# Patient Record
Sex: Female | Born: 1945 | Race: White | Hispanic: No | Smoking: Current every day smoker
Health system: Southern US, Community
[De-identification: ages and names within clinical notes are randomized; demographics above are authoritative.]

## PROBLEM LIST (undated history)

## (undated) DIAGNOSIS — I714 Abdominal aortic aneurysm, without rupture, unspecified: Secondary | ICD-10-CM

## (undated) DIAGNOSIS — T8859XA Other complications of anesthesia, initial encounter: Secondary | ICD-10-CM

## (undated) DIAGNOSIS — Z720 Tobacco use: Secondary | ICD-10-CM

## (undated) DIAGNOSIS — I639 Cerebral infarction, unspecified: Secondary | ICD-10-CM

## (undated) DIAGNOSIS — J449 Chronic obstructive pulmonary disease, unspecified: Secondary | ICD-10-CM

## (undated) DIAGNOSIS — I779 Disorder of arteries and arterioles, unspecified: Secondary | ICD-10-CM

## (undated) DIAGNOSIS — R58 Hemorrhage, not elsewhere classified: Secondary | ICD-10-CM

## (undated) DIAGNOSIS — I10 Essential (primary) hypertension: Secondary | ICD-10-CM

## (undated) DIAGNOSIS — D649 Anemia, unspecified: Secondary | ICD-10-CM

## (undated) DIAGNOSIS — T4145XA Adverse effect of unspecified anesthetic, initial encounter: Secondary | ICD-10-CM

## (undated) DIAGNOSIS — E785 Hyperlipidemia, unspecified: Secondary | ICD-10-CM

## (undated) DIAGNOSIS — R569 Unspecified convulsions: Secondary | ICD-10-CM

## (undated) DIAGNOSIS — I2510 Atherosclerotic heart disease of native coronary artery without angina pectoris: Secondary | ICD-10-CM

## (undated) DIAGNOSIS — K5790 Diverticulosis of intestine, part unspecified, without perforation or abscess without bleeding: Secondary | ICD-10-CM

## (undated) DIAGNOSIS — G459 Transient cerebral ischemic attack, unspecified: Secondary | ICD-10-CM

## (undated) DIAGNOSIS — I739 Peripheral vascular disease, unspecified: Secondary | ICD-10-CM

## (undated) DIAGNOSIS — R0602 Shortness of breath: Secondary | ICD-10-CM

## (undated) DIAGNOSIS — G40909 Epilepsy, unspecified, not intractable, without status epilepticus: Secondary | ICD-10-CM

## (undated) HISTORY — DX: Epilepsy, unspecified, not intractable, without status epilepticus: G40.909

## (undated) HISTORY — PX: COLON SURGERY: SHX602

## (undated) HISTORY — DX: Diverticulosis of intestine, part unspecified, without perforation or abscess without bleeding: K57.90

## (undated) HISTORY — DX: Peripheral vascular disease, unspecified: I73.9

## (undated) HISTORY — DX: Chronic obstructive pulmonary disease, unspecified: J44.9

## (undated) HISTORY — PX: COLOSTOMY CLOSURE: SHX1381

## (undated) HISTORY — DX: Abdominal aortic aneurysm, without rupture, unspecified: I71.40

## (undated) HISTORY — DX: Cerebral infarction, unspecified: I63.9

## (undated) HISTORY — DX: Hemorrhage, not elsewhere classified: R58

## (undated) HISTORY — PX: BRAIN SURGERY: SHX531

## (undated) HISTORY — DX: Disorder of arteries and arterioles, unspecified: I77.9

## (undated) HISTORY — DX: Tobacco use: Z72.0

## (undated) HISTORY — DX: Abdominal aortic aneurysm, without rupture: I71.4

## (undated) HISTORY — DX: Atherosclerotic heart disease of native coronary artery without angina pectoris: I25.10

## (undated) HISTORY — DX: Transient cerebral ischemic attack, unspecified: G45.9

## (undated) HISTORY — PX: OTHER SURGICAL HISTORY: SHX169

## (undated) HISTORY — DX: Hyperlipidemia, unspecified: E78.5

---

## 1979-09-01 DIAGNOSIS — I639 Cerebral infarction, unspecified: Secondary | ICD-10-CM

## 1979-09-01 HISTORY — DX: Cerebral infarction, unspecified: I63.9

## 1999-01-18 ENCOUNTER — Emergency Department (HOSPITAL_COMMUNITY): Admission: EM | Admit: 1999-01-18 | Discharge: 1999-01-19 | Payer: Self-pay | Admitting: Emergency Medicine

## 1999-01-19 ENCOUNTER — Encounter: Payer: Self-pay | Admitting: Emergency Medicine

## 1999-02-27 ENCOUNTER — Inpatient Hospital Stay (HOSPITAL_COMMUNITY): Admission: EM | Admit: 1999-02-27 | Discharge: 1999-02-28 | Payer: Self-pay | Admitting: Emergency Medicine

## 1999-03-02 ENCOUNTER — Encounter: Admission: RE | Admit: 1999-03-02 | Discharge: 1999-03-02 | Payer: Self-pay | Admitting: Family Medicine

## 1999-03-07 ENCOUNTER — Ambulatory Visit (HOSPITAL_COMMUNITY): Admission: RE | Admit: 1999-03-07 | Discharge: 1999-03-07 | Payer: Self-pay | Admitting: *Deleted

## 1999-03-24 ENCOUNTER — Encounter: Admission: RE | Admit: 1999-03-24 | Discharge: 1999-03-24 | Payer: Self-pay | Admitting: Family Medicine

## 1999-04-13 ENCOUNTER — Encounter: Admission: RE | Admit: 1999-04-13 | Discharge: 1999-04-13 | Payer: Self-pay | Admitting: Family Medicine

## 1999-04-20 ENCOUNTER — Encounter: Admission: RE | Admit: 1999-04-20 | Discharge: 1999-04-20 | Payer: Self-pay | Admitting: Family Medicine

## 1999-04-24 ENCOUNTER — Encounter: Admission: RE | Admit: 1999-04-24 | Discharge: 1999-04-24 | Payer: Self-pay | Admitting: Family Medicine

## 1999-05-05 ENCOUNTER — Encounter: Admission: RE | Admit: 1999-05-05 | Discharge: 1999-05-05 | Payer: Self-pay | Admitting: Family Medicine

## 1999-06-09 ENCOUNTER — Encounter: Admission: RE | Admit: 1999-06-09 | Discharge: 1999-06-09 | Payer: Self-pay | Admitting: Family Medicine

## 1999-06-21 ENCOUNTER — Ambulatory Visit (HOSPITAL_COMMUNITY): Admission: RE | Admit: 1999-06-21 | Discharge: 1999-06-21 | Payer: Self-pay | Admitting: Sports Medicine

## 2000-01-19 ENCOUNTER — Encounter: Payer: Self-pay | Admitting: Sports Medicine

## 2000-01-19 ENCOUNTER — Encounter: Admission: RE | Admit: 2000-01-19 | Discharge: 2000-01-19 | Payer: Self-pay | Admitting: Family Medicine

## 2000-01-19 ENCOUNTER — Encounter: Admission: RE | Admit: 2000-01-19 | Discharge: 2000-01-19 | Payer: Self-pay | Admitting: Sports Medicine

## 2000-06-24 ENCOUNTER — Ambulatory Visit (HOSPITAL_COMMUNITY): Admission: RE | Admit: 2000-06-24 | Discharge: 2000-06-24 | Payer: Self-pay | Admitting: *Deleted

## 2000-08-06 ENCOUNTER — Encounter: Admission: RE | Admit: 2000-08-06 | Discharge: 2000-08-06 | Payer: Self-pay | Admitting: Family Medicine

## 2000-08-19 ENCOUNTER — Encounter: Admission: RE | Admit: 2000-08-19 | Discharge: 2000-08-19 | Payer: Self-pay | Admitting: Family Medicine

## 2000-08-20 ENCOUNTER — Other Ambulatory Visit: Admission: RE | Admit: 2000-08-20 | Discharge: 2000-08-20 | Payer: Self-pay | Admitting: Family Medicine

## 2000-09-25 ENCOUNTER — Encounter: Admission: RE | Admit: 2000-09-25 | Discharge: 2000-09-25 | Payer: Self-pay | Admitting: Family Medicine

## 2000-11-26 ENCOUNTER — Encounter: Admission: RE | Admit: 2000-11-26 | Discharge: 2000-11-26 | Payer: Self-pay | Admitting: Family Medicine

## 2000-12-31 DIAGNOSIS — I2510 Atherosclerotic heart disease of native coronary artery without angina pectoris: Secondary | ICD-10-CM

## 2000-12-31 HISTORY — PX: OTHER SURGICAL HISTORY: SHX169

## 2000-12-31 HISTORY — DX: Atherosclerotic heart disease of native coronary artery without angina pectoris: I25.10

## 2001-01-30 ENCOUNTER — Encounter: Admission: RE | Admit: 2001-01-30 | Discharge: 2001-01-30 | Payer: Self-pay | Admitting: Family Medicine

## 2001-02-03 ENCOUNTER — Encounter: Payer: Self-pay | Admitting: Emergency Medicine

## 2001-02-03 ENCOUNTER — Emergency Department (HOSPITAL_COMMUNITY): Admission: EM | Admit: 2001-02-03 | Discharge: 2001-02-04 | Payer: Self-pay | Admitting: Emergency Medicine

## 2001-02-13 ENCOUNTER — Encounter: Admission: RE | Admit: 2001-02-13 | Discharge: 2001-02-13 | Payer: Self-pay | Admitting: Family Medicine

## 2001-03-13 ENCOUNTER — Encounter: Admission: RE | Admit: 2001-03-13 | Discharge: 2001-03-13 | Payer: Self-pay | Admitting: Sports Medicine

## 2001-05-14 ENCOUNTER — Encounter: Admission: RE | Admit: 2001-05-14 | Discharge: 2001-05-14 | Payer: Self-pay | Admitting: Family Medicine

## 2001-06-12 ENCOUNTER — Encounter: Payer: Self-pay | Admitting: Sports Medicine

## 2001-06-12 ENCOUNTER — Encounter: Admission: RE | Admit: 2001-06-12 | Discharge: 2001-06-12 | Payer: Self-pay | Admitting: Sports Medicine

## 2001-06-12 ENCOUNTER — Encounter: Admission: RE | Admit: 2001-06-12 | Discharge: 2001-06-12 | Payer: Self-pay | Admitting: Family Medicine

## 2001-06-15 ENCOUNTER — Emergency Department (HOSPITAL_COMMUNITY): Admission: EM | Admit: 2001-06-15 | Discharge: 2001-06-15 | Payer: Self-pay

## 2001-06-16 ENCOUNTER — Encounter: Admission: RE | Admit: 2001-06-16 | Discharge: 2001-06-16 | Payer: Self-pay | Admitting: Family Medicine

## 2001-08-17 ENCOUNTER — Encounter: Payer: Self-pay | Admitting: Emergency Medicine

## 2001-08-17 ENCOUNTER — Inpatient Hospital Stay (HOSPITAL_COMMUNITY): Admission: EM | Admit: 2001-08-17 | Discharge: 2001-08-20 | Payer: Self-pay | Admitting: Cardiology

## 2001-09-22 ENCOUNTER — Encounter: Admission: RE | Admit: 2001-09-22 | Discharge: 2001-09-22 | Payer: Self-pay | Admitting: Family Medicine

## 2001-11-19 ENCOUNTER — Encounter: Admission: RE | Admit: 2001-11-19 | Discharge: 2001-11-19 | Payer: Self-pay | Admitting: Family Medicine

## 2001-11-25 ENCOUNTER — Encounter: Payer: Self-pay | Admitting: Family Medicine

## 2001-11-25 ENCOUNTER — Ambulatory Visit (HOSPITAL_COMMUNITY): Admission: RE | Admit: 2001-11-25 | Discharge: 2001-11-25 | Payer: Self-pay | Admitting: Family Medicine

## 2002-02-19 ENCOUNTER — Encounter: Admission: RE | Admit: 2002-02-19 | Discharge: 2002-02-19 | Payer: Self-pay | Admitting: Family Medicine

## 2002-11-03 ENCOUNTER — Encounter: Admission: RE | Admit: 2002-11-03 | Discharge: 2002-11-03 | Payer: Self-pay | Admitting: Family Medicine

## 2002-12-21 ENCOUNTER — Encounter: Payer: Self-pay | Admitting: Family Medicine

## 2002-12-21 ENCOUNTER — Ambulatory Visit (HOSPITAL_COMMUNITY): Admission: RE | Admit: 2002-12-21 | Discharge: 2002-12-21 | Payer: Self-pay | Admitting: Family Medicine

## 2002-12-22 ENCOUNTER — Encounter: Admission: RE | Admit: 2002-12-22 | Discharge: 2002-12-22 | Payer: Self-pay | Admitting: Sports Medicine

## 2003-01-05 ENCOUNTER — Encounter: Admission: RE | Admit: 2003-01-05 | Discharge: 2003-01-05 | Payer: Self-pay | Admitting: Family Medicine

## 2003-10-07 ENCOUNTER — Encounter: Admission: RE | Admit: 2003-10-07 | Discharge: 2003-10-07 | Payer: Self-pay | Admitting: Family Medicine

## 2003-12-23 ENCOUNTER — Ambulatory Visit (HOSPITAL_COMMUNITY): Admission: RE | Admit: 2003-12-23 | Discharge: 2003-12-23 | Payer: Self-pay | Admitting: Sports Medicine

## 2004-03-29 ENCOUNTER — Ambulatory Visit (HOSPITAL_COMMUNITY): Admission: RE | Admit: 2004-03-29 | Discharge: 2004-03-29 | Payer: Self-pay | Admitting: Family Medicine

## 2004-03-29 ENCOUNTER — Encounter: Admission: RE | Admit: 2004-03-29 | Discharge: 2004-03-29 | Payer: Self-pay | Admitting: Family Medicine

## 2004-03-30 ENCOUNTER — Ambulatory Visit (HOSPITAL_COMMUNITY): Admission: RE | Admit: 2004-03-30 | Discharge: 2004-03-30 | Payer: Self-pay | Admitting: Family Medicine

## 2004-11-30 ENCOUNTER — Encounter (INDEPENDENT_AMBULATORY_CARE_PROVIDER_SITE_OTHER): Payer: Self-pay | Admitting: *Deleted

## 2004-11-30 LAB — CONVERTED CEMR LAB

## 2004-12-11 ENCOUNTER — Other Ambulatory Visit: Admission: RE | Admit: 2004-12-11 | Discharge: 2004-12-11 | Payer: Self-pay | Admitting: Family Medicine

## 2004-12-11 ENCOUNTER — Ambulatory Visit: Payer: Self-pay | Admitting: Sports Medicine

## 2005-07-25 ENCOUNTER — Ambulatory Visit: Payer: Self-pay | Admitting: Sports Medicine

## 2005-08-01 ENCOUNTER — Ambulatory Visit (HOSPITAL_COMMUNITY): Admission: RE | Admit: 2005-08-01 | Discharge: 2005-08-01 | Payer: Self-pay | Admitting: Family Medicine

## 2005-11-30 ENCOUNTER — Ambulatory Visit: Payer: Self-pay | Admitting: Family Medicine

## 2005-12-27 ENCOUNTER — Ambulatory Visit: Payer: Self-pay | Admitting: Sports Medicine

## 2005-12-31 HISTORY — PX: ABDOMINAL AORTIC ANEURYSM REPAIR: SUR1152

## 2006-01-20 ENCOUNTER — Ambulatory Visit: Payer: Self-pay | Admitting: Family Medicine

## 2006-01-20 ENCOUNTER — Inpatient Hospital Stay (HOSPITAL_COMMUNITY): Admission: AD | Admit: 2006-01-20 | Discharge: 2006-02-21 | Payer: Self-pay | Admitting: Family Medicine

## 2006-01-20 ENCOUNTER — Encounter: Payer: Self-pay | Admitting: Emergency Medicine

## 2006-01-20 ENCOUNTER — Ambulatory Visit: Payer: Self-pay | Admitting: Cardiology

## 2006-01-22 ENCOUNTER — Ambulatory Visit: Payer: Self-pay | Admitting: Gastroenterology

## 2006-01-23 ENCOUNTER — Ambulatory Visit: Payer: Self-pay | Admitting: Emergency Medicine

## 2006-01-23 ENCOUNTER — Encounter: Payer: Self-pay | Admitting: Cardiology

## 2006-01-24 ENCOUNTER — Ambulatory Visit: Payer: Self-pay | Admitting: Infectious Diseases

## 2006-01-29 ENCOUNTER — Encounter (INDEPENDENT_AMBULATORY_CARE_PROVIDER_SITE_OTHER): Payer: Self-pay | Admitting: *Deleted

## 2006-02-01 ENCOUNTER — Encounter (INDEPENDENT_AMBULATORY_CARE_PROVIDER_SITE_OTHER): Payer: Self-pay | Admitting: Specialist

## 2006-08-06 ENCOUNTER — Ambulatory Visit: Payer: Self-pay | Admitting: Internal Medicine

## 2006-08-13 ENCOUNTER — Ambulatory Visit: Payer: Self-pay | Admitting: Cardiology

## 2006-08-14 ENCOUNTER — Ambulatory Visit: Payer: Self-pay | Admitting: Internal Medicine

## 2006-08-14 ENCOUNTER — Encounter: Payer: Self-pay | Admitting: Internal Medicine

## 2006-08-19 ENCOUNTER — Emergency Department (HOSPITAL_COMMUNITY): Admission: EM | Admit: 2006-08-19 | Discharge: 2006-08-19 | Payer: Self-pay | Admitting: Emergency Medicine

## 2006-10-25 ENCOUNTER — Ambulatory Visit: Payer: Self-pay | Admitting: Pulmonary Disease

## 2006-10-25 ENCOUNTER — Inpatient Hospital Stay (HOSPITAL_COMMUNITY): Admission: RE | Admit: 2006-10-25 | Discharge: 2006-11-12 | Payer: Self-pay

## 2007-02-27 DIAGNOSIS — I6789 Other cerebrovascular disease: Secondary | ICD-10-CM

## 2007-02-27 DIAGNOSIS — G459 Transient cerebral ischemic attack, unspecified: Secondary | ICD-10-CM | POA: Insufficient documentation

## 2007-02-27 DIAGNOSIS — E781 Pure hyperglyceridemia: Secondary | ICD-10-CM | POA: Insufficient documentation

## 2007-02-27 DIAGNOSIS — I252 Old myocardial infarction: Secondary | ICD-10-CM

## 2007-02-27 DIAGNOSIS — R55 Syncope and collapse: Secondary | ICD-10-CM

## 2007-02-27 DIAGNOSIS — E7800 Pure hypercholesterolemia, unspecified: Secondary | ICD-10-CM

## 2007-02-27 DIAGNOSIS — F17200 Nicotine dependence, unspecified, uncomplicated: Secondary | ICD-10-CM

## 2007-02-28 ENCOUNTER — Encounter (INDEPENDENT_AMBULATORY_CARE_PROVIDER_SITE_OTHER): Payer: Self-pay | Admitting: *Deleted

## 2007-08-11 ENCOUNTER — Telehealth: Payer: Self-pay | Admitting: *Deleted

## 2007-08-15 ENCOUNTER — Ambulatory Visit: Payer: Self-pay | Admitting: Family Medicine

## 2007-08-15 ENCOUNTER — Encounter (INDEPENDENT_AMBULATORY_CARE_PROVIDER_SITE_OTHER): Payer: Self-pay | Admitting: Family Medicine

## 2007-08-15 DIAGNOSIS — D649 Anemia, unspecified: Secondary | ICD-10-CM

## 2007-08-15 LAB — CONVERTED CEMR LAB
ALT: 16 units/L (ref 0–35)
BUN: 17 mg/dL (ref 6–23)
Creatinine, Ser: 0.77 mg/dL (ref 0.40–1.20)
Glucose, Bld: 92 mg/dL (ref 70–99)
HDL: 36 mg/dL — ABNORMAL LOW (ref >39)
Potassium: 4.3 meq/L (ref 3.5–5.3)
RBC: 4.83 M/uL (ref 3.87–5.11)
RDW: 13.9 % (ref 11.5–14.0)
Sodium: 142 meq/L (ref 135–145)
Total Bilirubin: 0.5 mg/dL (ref 0.3–1.2)
Total CHOL/HDL Ratio: 4.8
WBC: 7.7 10*3/uL (ref 4.0–10.5)

## 2007-08-16 ENCOUNTER — Encounter (INDEPENDENT_AMBULATORY_CARE_PROVIDER_SITE_OTHER): Payer: Self-pay | Admitting: Family Medicine

## 2007-08-19 ENCOUNTER — Encounter (INDEPENDENT_AMBULATORY_CARE_PROVIDER_SITE_OTHER): Payer: Self-pay | Admitting: Infectious Diseases

## 2007-08-19 ENCOUNTER — Ambulatory Visit: Payer: Self-pay | Admitting: Cardiology

## 2008-04-09 ENCOUNTER — Ambulatory Visit (HOSPITAL_COMMUNITY): Admission: RE | Admit: 2008-04-09 | Discharge: 2008-04-09 | Payer: Self-pay | Admitting: Family Medicine

## 2008-07-19 ENCOUNTER — Encounter (INDEPENDENT_AMBULATORY_CARE_PROVIDER_SITE_OTHER): Payer: Self-pay | Admitting: Family Medicine

## 2008-07-19 ENCOUNTER — Other Ambulatory Visit: Admission: RE | Admit: 2008-07-19 | Discharge: 2008-07-19 | Payer: Self-pay | Admitting: Family Medicine

## 2008-07-19 ENCOUNTER — Ambulatory Visit: Payer: Self-pay | Admitting: Family Medicine

## 2008-07-19 DIAGNOSIS — N951 Menopausal and female climacteric states: Secondary | ICD-10-CM

## 2008-07-23 ENCOUNTER — Encounter: Payer: Self-pay | Admitting: Family Medicine

## 2008-07-23 LAB — CONVERTED CEMR LAB: Pap Smear: NORMAL

## 2008-09-01 ENCOUNTER — Inpatient Hospital Stay (HOSPITAL_COMMUNITY): Admission: EM | Admit: 2008-09-01 | Discharge: 2008-09-03 | Payer: Self-pay | Admitting: Emergency Medicine

## 2008-09-01 ENCOUNTER — Ambulatory Visit: Payer: Self-pay | Admitting: Family Medicine

## 2008-09-01 ENCOUNTER — Encounter: Payer: Self-pay | Admitting: Sports Medicine

## 2008-09-01 DIAGNOSIS — R569 Unspecified convulsions: Secondary | ICD-10-CM | POA: Insufficient documentation

## 2008-09-07 ENCOUNTER — Ambulatory Visit: Payer: Self-pay | Admitting: Family Medicine

## 2008-09-07 DIAGNOSIS — R5381 Other malaise: Secondary | ICD-10-CM | POA: Insufficient documentation

## 2008-09-07 DIAGNOSIS — R5383 Other fatigue: Secondary | ICD-10-CM

## 2008-09-08 ENCOUNTER — Ambulatory Visit: Payer: Self-pay | Admitting: Cardiology

## 2008-09-08 ENCOUNTER — Encounter: Payer: Self-pay | Admitting: Family Medicine

## 2008-09-23 ENCOUNTER — Encounter: Payer: Self-pay | Admitting: Family Medicine

## 2008-09-25 IMAGING — CR DG CHEST 2V
2 series · 2 of 2 positions shown · non-contrast
Comparison: 11/04/2006

CLINICAL DATA: Shortness of breath

CHEST - 2 VIEW

[w chest pa]
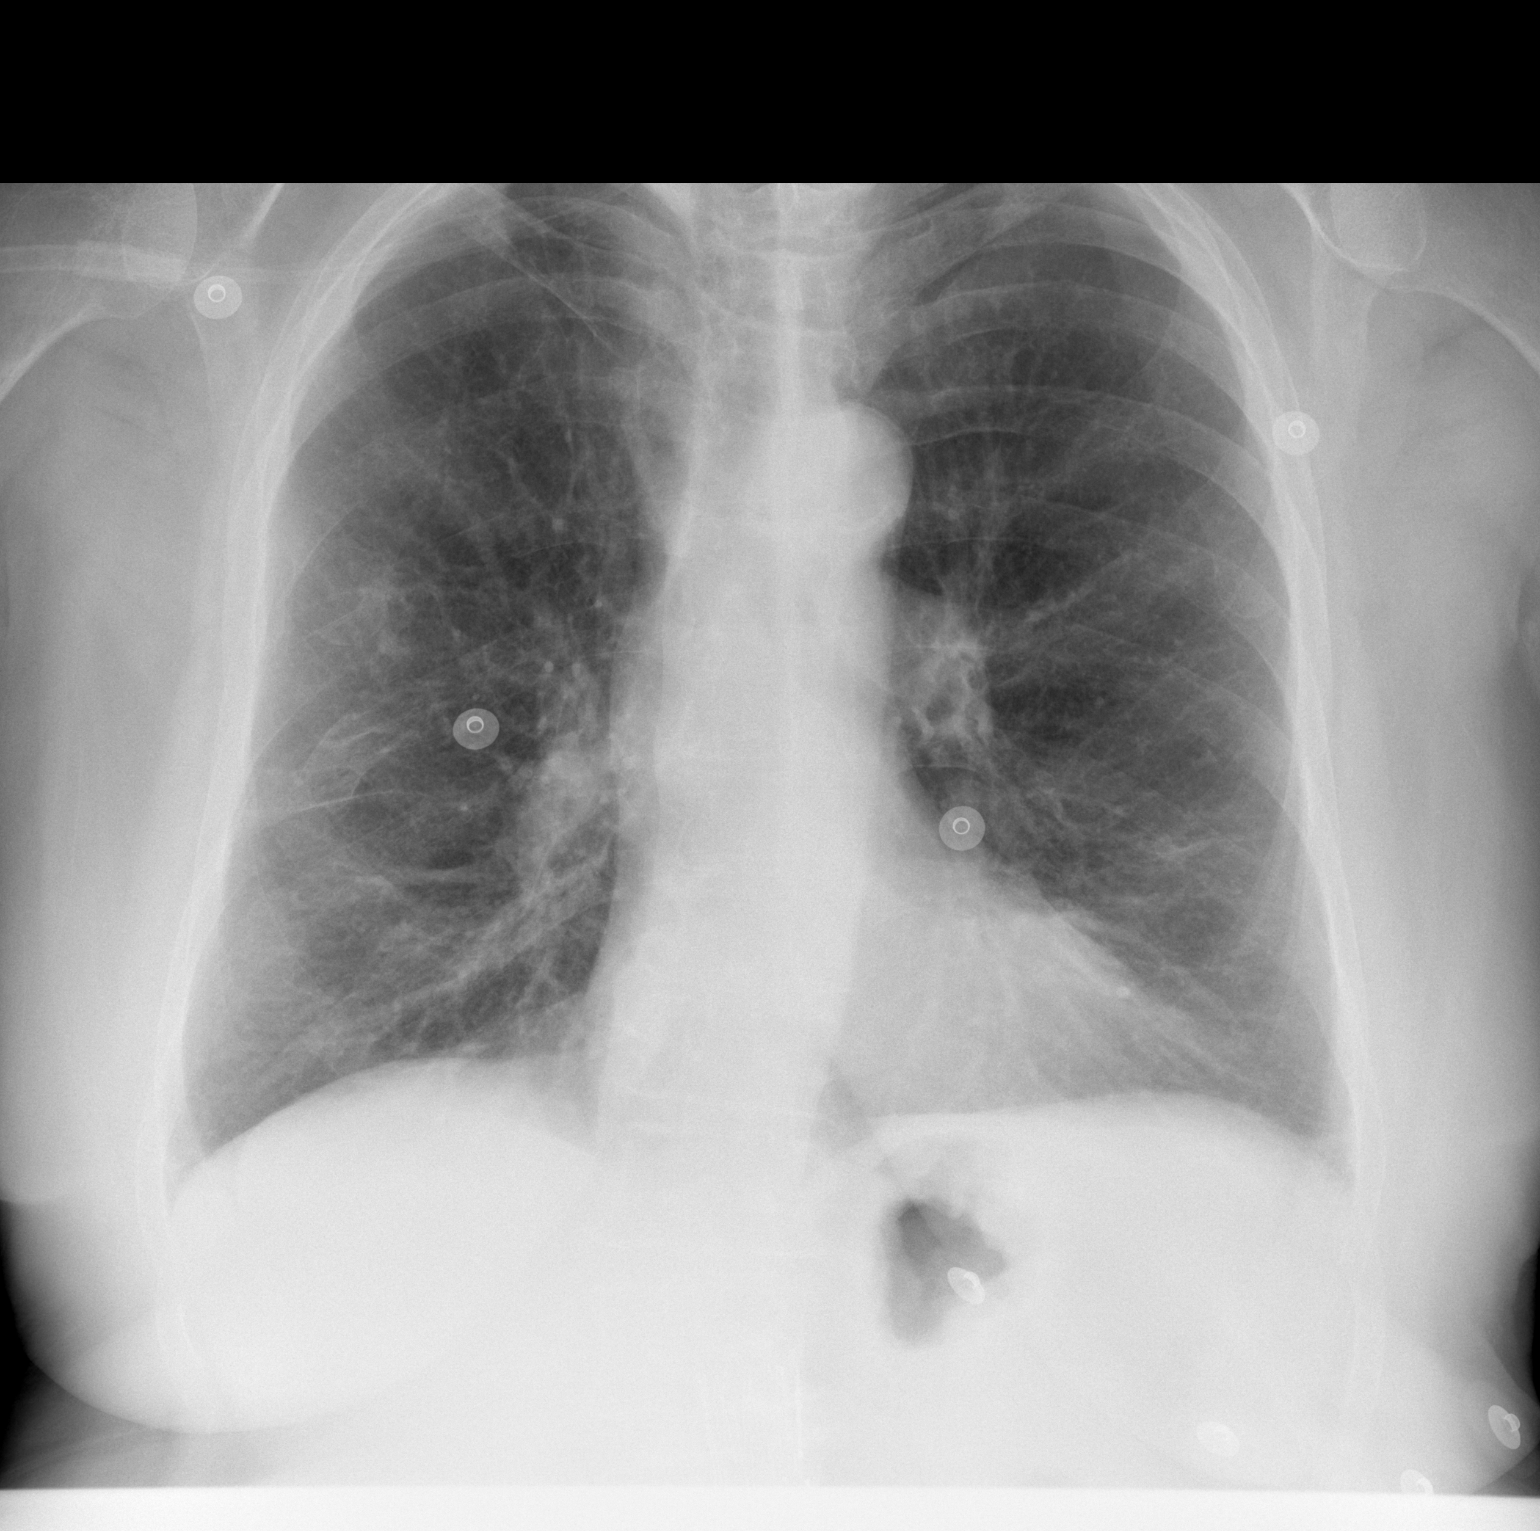

[w chest lat]
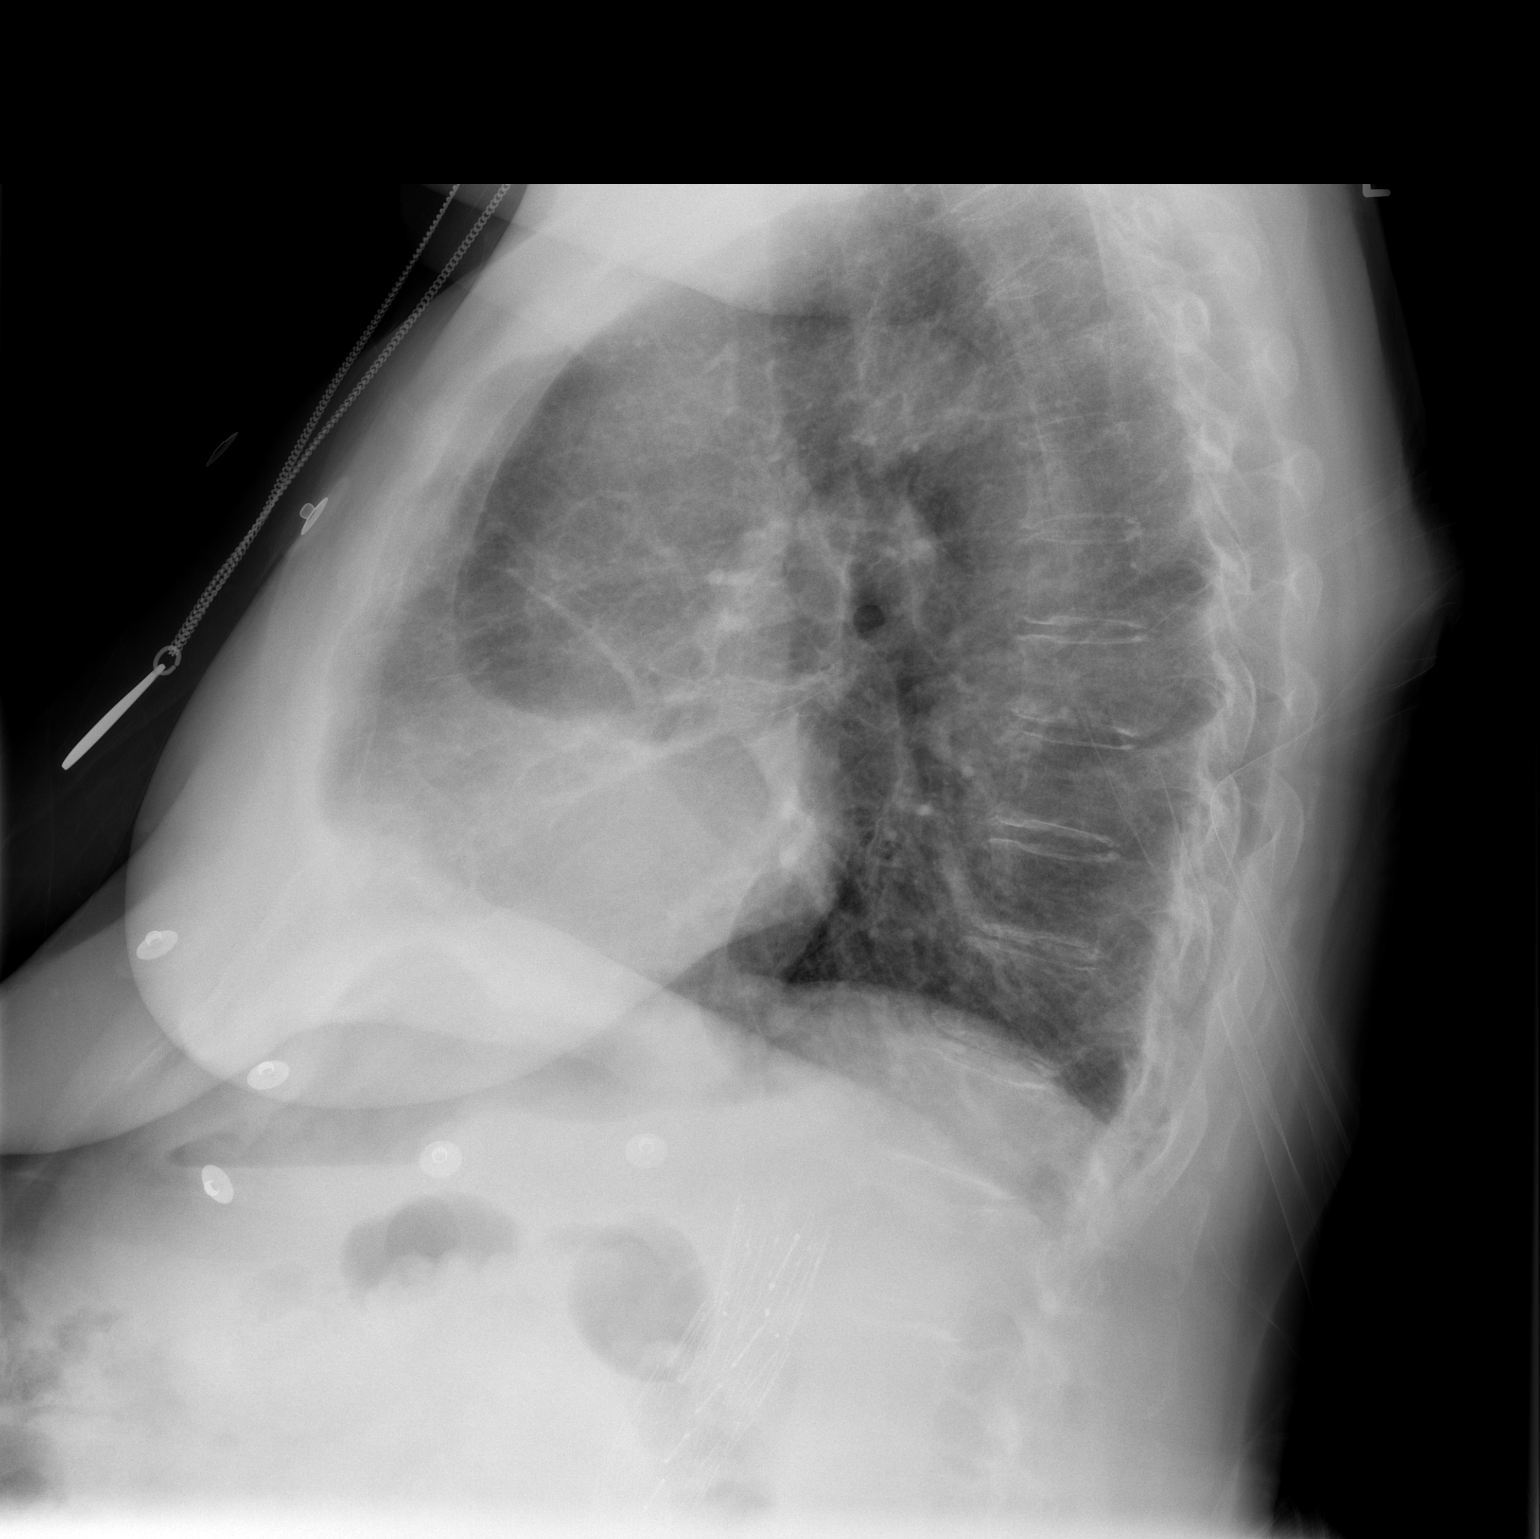

[2 of 2 positions shown; findings below may reference images not displayed]

FINDINGS: Chronic interstitial changes are redemonstrated.  Remote
posterior right rib fractures are noted.  The heart is normal.
Mediastinum and hila are negative for adenopathy.  Mid thoracic
spine degenerative changes.

Aortic vascular stent is identified.
IMPRESSION: Chronic interstitial lung changes.  Stable chest.

## 2008-09-26 IMAGING — CT CT HEAD W/O CM
1 of 2 series · 13 of 30 positions shown, 17 images · non-contrast
Comparison: 01/20/2006

CLINICAL DATA: Seizure.  Difficulty breathing.  Altered level of
consciousness.

CT HEAD WITHOUT CONTRAST
TECHNIQUE: Contiguous axial images were obtained from the base of
the skull through the vertex without contrast.

[Series 2: brain · axial · 0.47mm/px · z∈[+152,+295]mm · 13 of 32 slices shown, 17 images]
[im 3/32  brain]
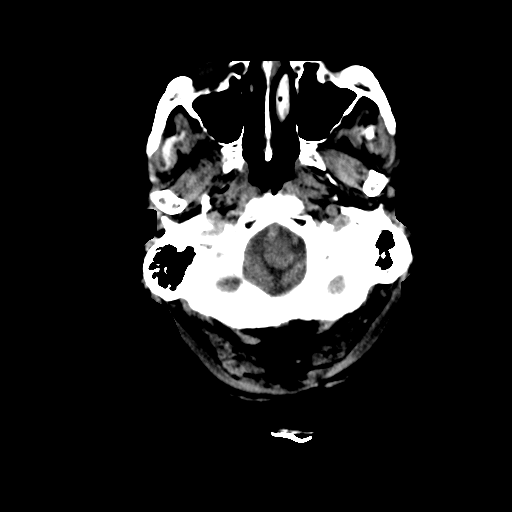
[im 3/32  bone]
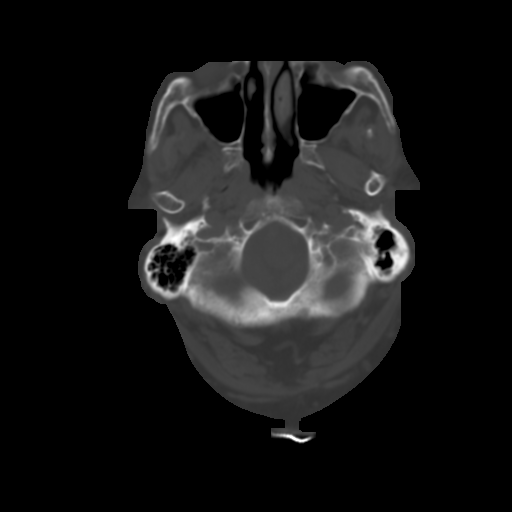
[im 5/32  brain]
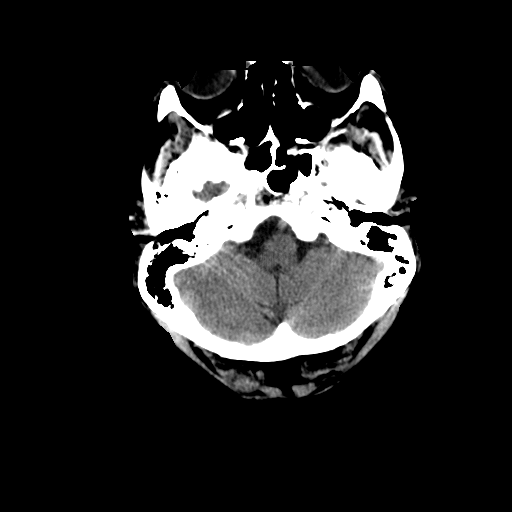
[im 7/32  brain]
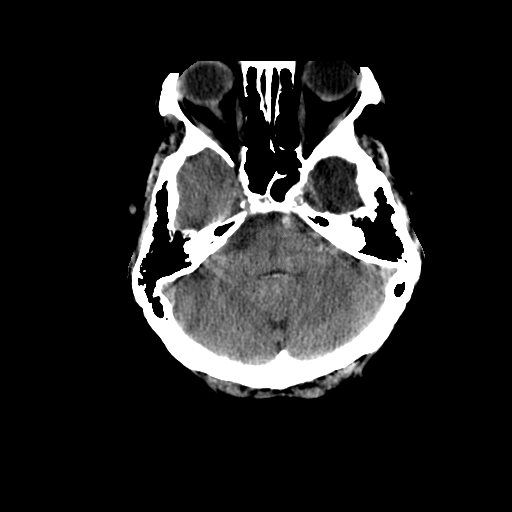
[im 9/32  brain]
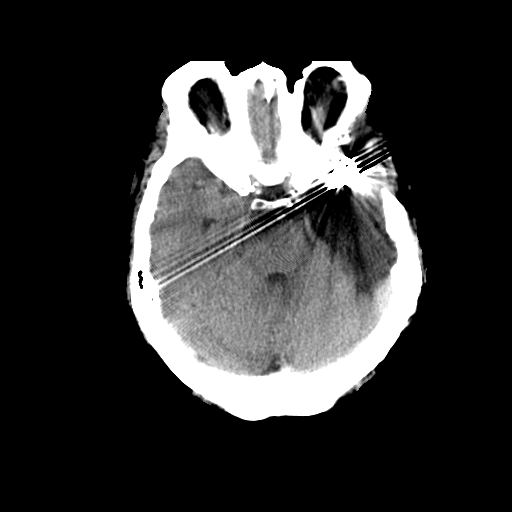
[im 12/32  brain]
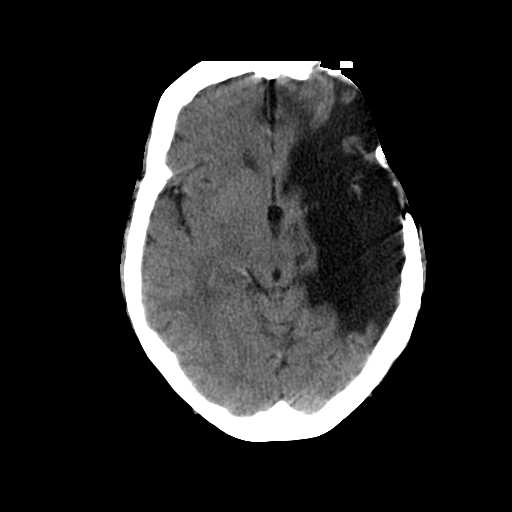
[im 12/32  bone]
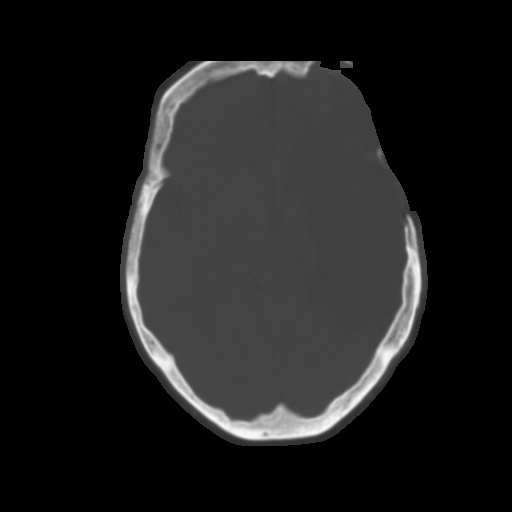
[im 14/32  brain]
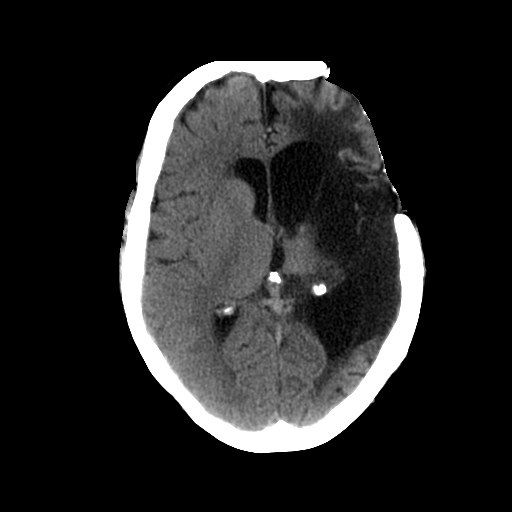
[im 16/32  brain]
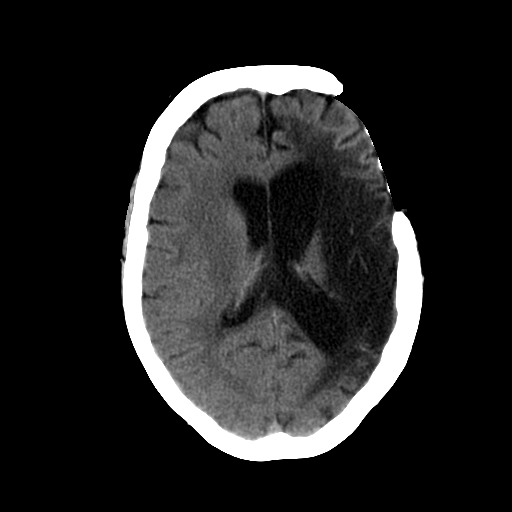
[im 18/32  brain]
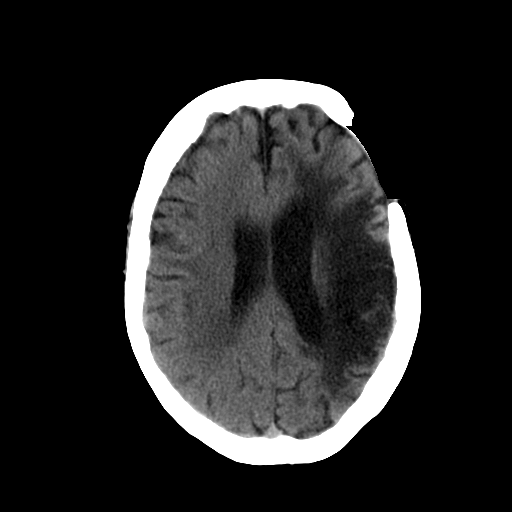
[im 20/32  brain]
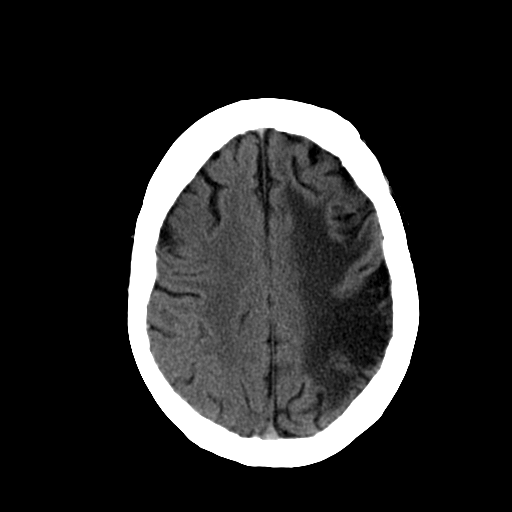
[im 20/32  bone]
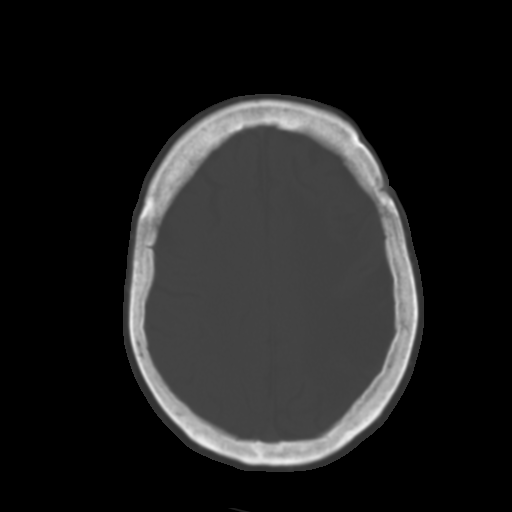
[im 23/32  brain]
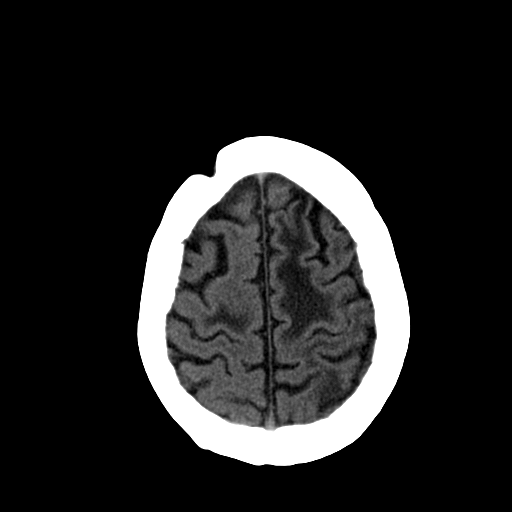
[im 25/32  brain]
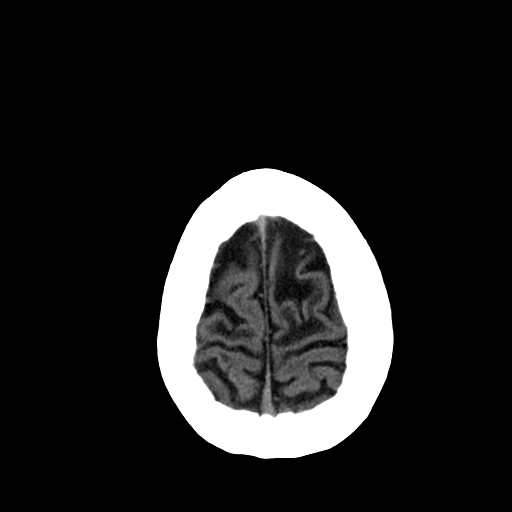
[im 27/32  brain]
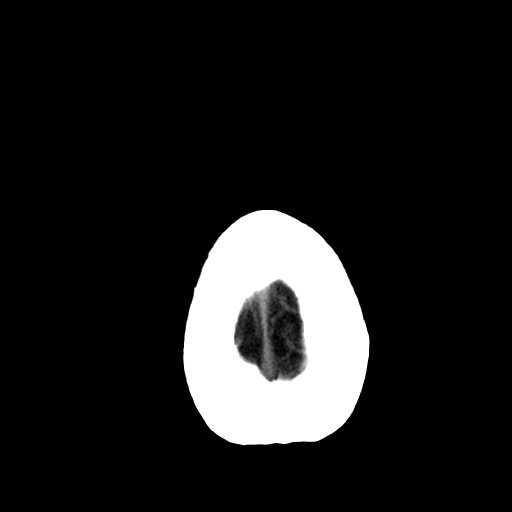
[im 29/32  brain]
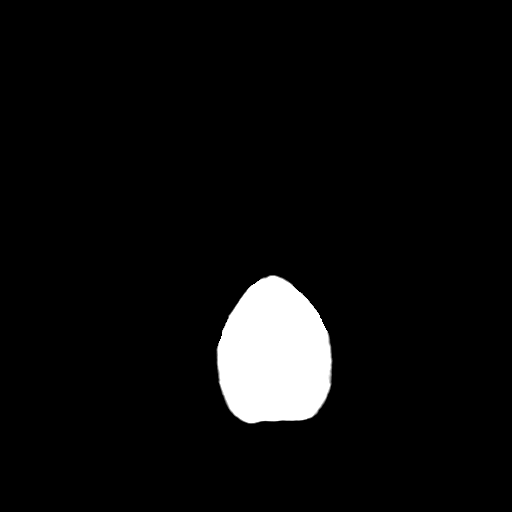
[im 29/32  bone]
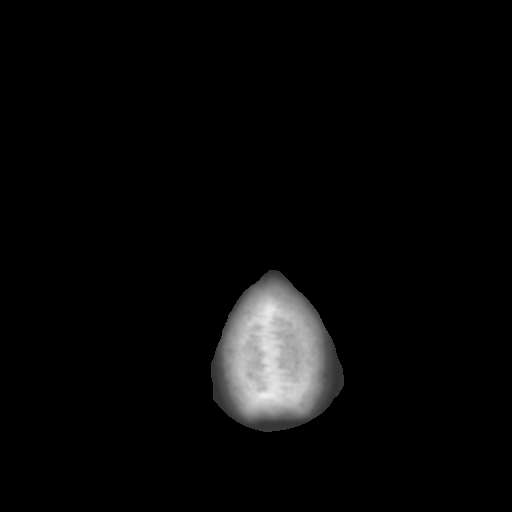

[13 of 30 positions shown; findings below may reference images not displayed]

FINDINGS: An extensive left MCA distribution infarct with a left
MCA aneurysmal clip is redemonstrated. Compensatory ventricular
dilatation.  No acute intracranial hemorrhage or edema.

The mastoid air cells and sinuses are clear Periventricular small
vessel ischemic changes are noted.
IMPRESSION: Remote left MCA distribution infarct.  Compensatory ventricular
dilatation.  No acute intracranial hemorrhage or edema.

## 2008-09-27 ENCOUNTER — Ambulatory Visit: Payer: Self-pay | Admitting: Family Medicine

## 2008-09-27 IMAGING — CR DG CHEST 1V PORT
1 series · 1 of 1 positions shown · non-contrast
Comparison: 08/31/2008

CLINICAL DATA: Seizure disorder.  Rule out aspiration.

PORTABLE CHEST - 1 VIEW

[AP]
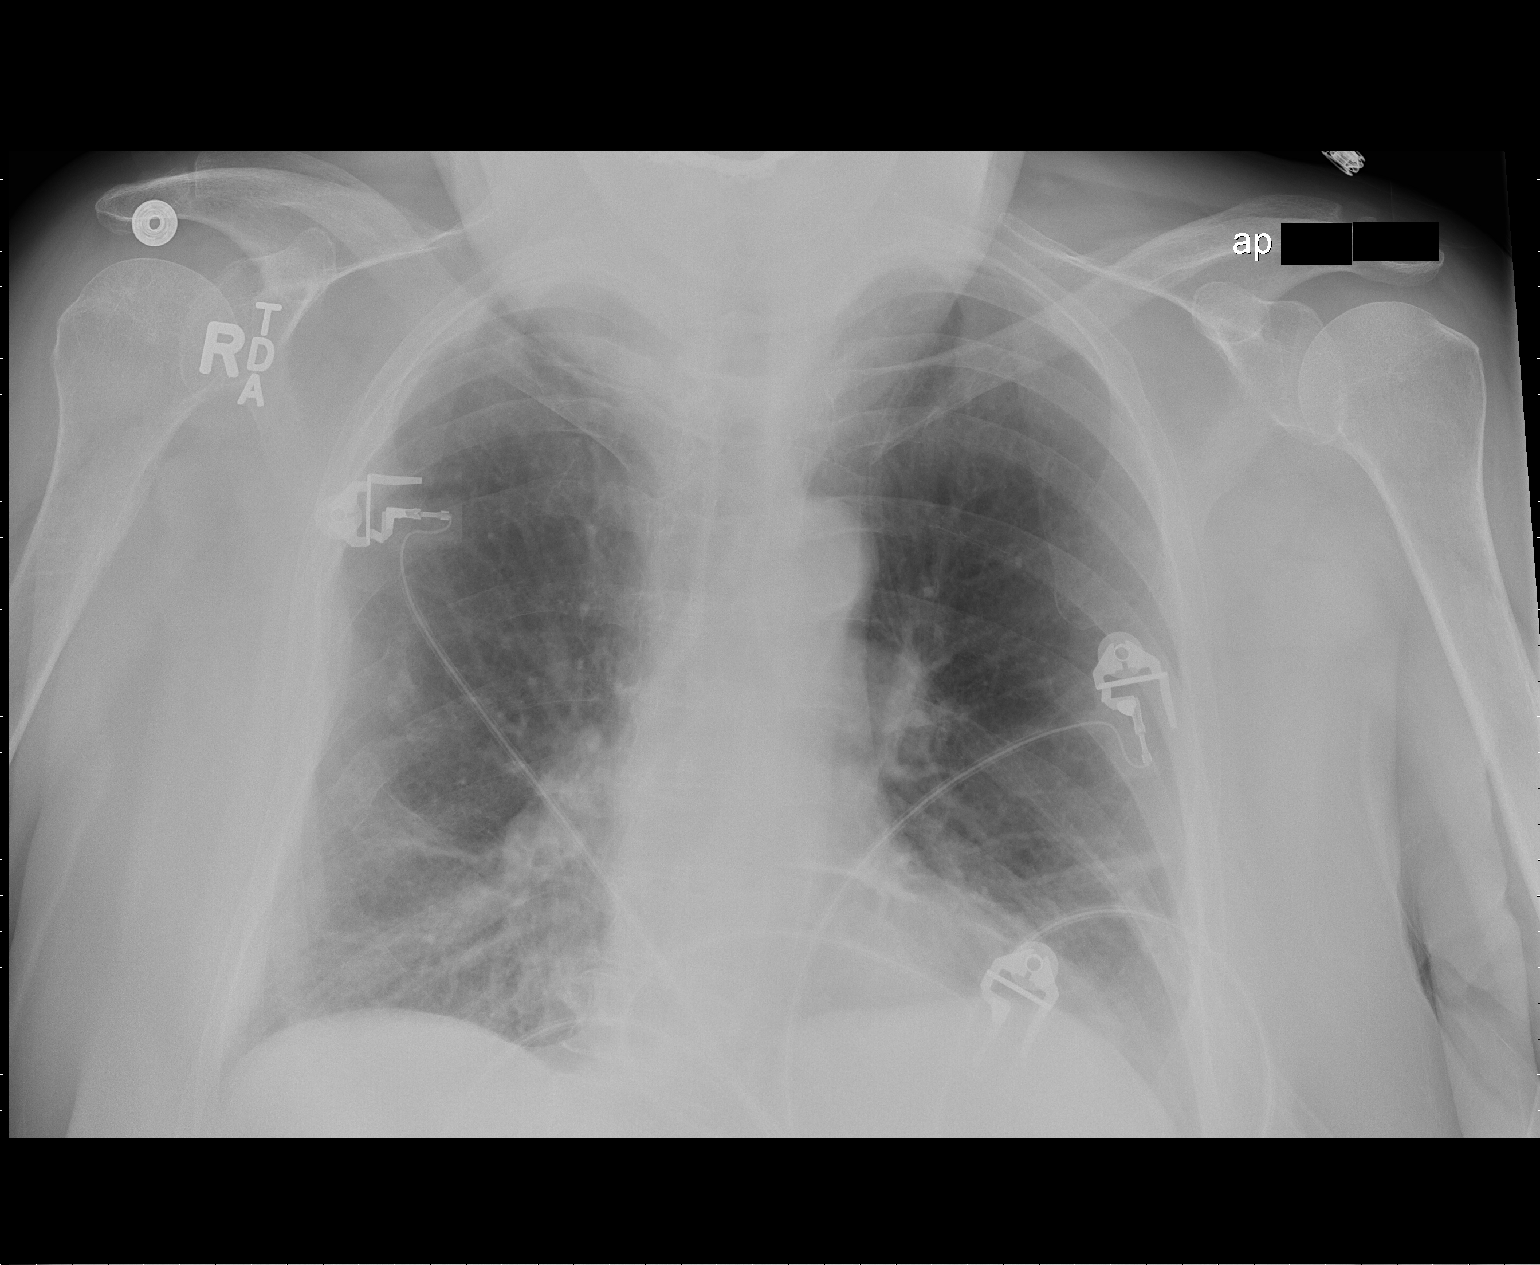

[1 of 1 positions shown; findings below may reference images not displayed]

FINDINGS: Linear densities at the lung bases compatible with
subsegmental atelectasis and/or scarring.  No airspace opacities.
No CHF.
IMPRESSION: Subsegmental atelectasis and/or scarring at the bases.  Negative
for pneumonia.

## 2008-10-20 ENCOUNTER — Telehealth: Payer: Self-pay | Admitting: *Deleted

## 2008-10-22 ENCOUNTER — Ambulatory Visit: Payer: Self-pay | Admitting: Family Medicine

## 2008-11-22 ENCOUNTER — Ambulatory Visit: Payer: Self-pay | Admitting: Family Medicine

## 2008-12-28 ENCOUNTER — Encounter: Payer: Self-pay | Admitting: Family Medicine

## 2009-02-28 ENCOUNTER — Telehealth: Payer: Self-pay | Admitting: *Deleted

## 2009-03-02 ENCOUNTER — Encounter: Payer: Self-pay | Admitting: Family Medicine

## 2009-03-03 ENCOUNTER — Ambulatory Visit: Payer: Self-pay | Admitting: Family Medicine

## 2009-03-18 ENCOUNTER — Encounter: Payer: Self-pay | Admitting: *Deleted

## 2009-03-22 ENCOUNTER — Encounter: Payer: Self-pay | Admitting: *Deleted

## 2009-03-22 ENCOUNTER — Encounter: Payer: Self-pay | Admitting: Family Medicine

## 2009-03-22 ENCOUNTER — Ambulatory Visit: Payer: Self-pay | Admitting: Family Medicine

## 2009-03-24 ENCOUNTER — Encounter: Payer: Self-pay | Admitting: Family Medicine

## 2009-03-24 LAB — CONVERTED CEMR LAB
AST: 16 units/L (ref 0–37)
BUN: 13 mg/dL (ref 6–23)
CO2: 28 meq/L (ref 19–32)
Calcium: 9.7 mg/dL (ref 8.4–10.5)
Chloride: 104 meq/L (ref 96–112)
Cholesterol: 161 mg/dL (ref 0–200)
Creatinine, Ser: 0.81 mg/dL (ref 0.40–1.20)
HDL: 35 mg/dL — ABNORMAL LOW (ref >39)
Total Bilirubin: 0.4 mg/dL (ref 0.3–1.2)
Total CHOL/HDL Ratio: 4.6

## 2009-05-09 ENCOUNTER — Encounter: Payer: Self-pay | Admitting: *Deleted

## 2009-05-10 ENCOUNTER — Telehealth: Payer: Self-pay | Admitting: Family Medicine

## 2009-05-16 ENCOUNTER — Encounter: Payer: Self-pay | Admitting: Family Medicine

## 2009-06-13 ENCOUNTER — Ambulatory Visit: Payer: Self-pay | Admitting: Family Medicine

## 2009-06-13 DIAGNOSIS — D239 Other benign neoplasm of skin, unspecified: Secondary | ICD-10-CM | POA: Insufficient documentation

## 2009-06-28 ENCOUNTER — Telehealth: Payer: Self-pay | Admitting: Family Medicine

## 2009-07-06 ENCOUNTER — Encounter (INDEPENDENT_AMBULATORY_CARE_PROVIDER_SITE_OTHER): Payer: Self-pay | Admitting: *Deleted

## 2009-07-22 ENCOUNTER — Encounter (INDEPENDENT_AMBULATORY_CARE_PROVIDER_SITE_OTHER): Payer: Self-pay | Admitting: Family Medicine

## 2009-08-01 ENCOUNTER — Encounter: Payer: Self-pay | Admitting: Family Medicine

## 2009-08-15 ENCOUNTER — Telehealth: Payer: Self-pay | Admitting: *Deleted

## 2009-08-15 ENCOUNTER — Encounter: Payer: Self-pay | Admitting: Family Medicine

## 2009-08-23 ENCOUNTER — Encounter (INDEPENDENT_AMBULATORY_CARE_PROVIDER_SITE_OTHER): Payer: Self-pay | Admitting: *Deleted

## 2009-10-12 ENCOUNTER — Ambulatory Visit: Payer: Self-pay | Admitting: Family Medicine

## 2009-10-12 ENCOUNTER — Encounter: Payer: Self-pay | Admitting: Family Medicine

## 2009-10-13 ENCOUNTER — Encounter: Payer: Self-pay | Admitting: Family Medicine

## 2009-10-21 ENCOUNTER — Encounter: Payer: Self-pay | Admitting: Family Medicine

## 2009-10-23 ENCOUNTER — Encounter: Payer: Self-pay | Admitting: Cardiology

## 2009-10-23 DIAGNOSIS — Z9889 Other specified postprocedural states: Secondary | ICD-10-CM

## 2009-10-24 ENCOUNTER — Ambulatory Visit: Payer: Self-pay | Admitting: Cardiology

## 2010-01-11 ENCOUNTER — Ambulatory Visit (HOSPITAL_COMMUNITY): Admission: RE | Admit: 2010-01-11 | Discharge: 2010-01-11 | Payer: Self-pay | Admitting: Family Medicine

## 2010-01-12 ENCOUNTER — Telehealth: Payer: Self-pay | Admitting: Internal Medicine

## 2010-01-23 ENCOUNTER — Encounter: Payer: Self-pay | Admitting: Family Medicine

## 2010-06-15 ENCOUNTER — Encounter: Payer: Self-pay | Admitting: Family Medicine

## 2010-06-20 ENCOUNTER — Ambulatory Visit: Payer: Self-pay | Admitting: Family Medicine

## 2010-06-20 ENCOUNTER — Encounter: Payer: Self-pay | Admitting: Family Medicine

## 2010-10-11 ENCOUNTER — Ambulatory Visit: Payer: Self-pay | Admitting: Cardiology

## 2010-10-16 ENCOUNTER — Telehealth: Payer: Self-pay | Admitting: Cardiology

## 2010-11-08 ENCOUNTER — Encounter: Payer: Self-pay | Admitting: Cardiology

## 2010-11-08 ENCOUNTER — Ambulatory Visit: Payer: Self-pay

## 2010-12-08 ENCOUNTER — Telehealth: Payer: Self-pay | Admitting: Family Medicine

## 2010-12-26 ENCOUNTER — Encounter: Payer: Self-pay | Admitting: Family Medicine

## 2011-01-22 ENCOUNTER — Encounter: Payer: Self-pay | Admitting: Family Medicine

## 2011-01-25 ENCOUNTER — Encounter (INDEPENDENT_AMBULATORY_CARE_PROVIDER_SITE_OTHER): Payer: Self-pay | Admitting: *Deleted

## 2011-01-30 NOTE — Assessment & Plan Note (Signed)
 Summary: Hospital H&P   Vital Signs:  Patient Profile:   66 Years Old Female O2 Sat:      91 % O2 treatment:    Room Air Temp:     98 degrees F oral Pulse rate:   86 / minute Resp:     20 per minute BP supine:   177 / 70                 PCP:  STEPHANIE ALM  MD   History of Present Illness: CC: SOB, Weakness, Abnormal movements  This is a 66 year old female with a peculiar history of events that resemble seizures.  It began today, the pt became short of breath and called her daughter.  Was having lots of difficulty breathing so daughter raced over to find her clutching the phone and unable to breathe.  Called EMS and they brought her here.  She would also have these events where her right hand would move in an abnormal way, and then she would have difficulty breathing.  No generalization noted.  She was possibly sometimes aware during these events and sometimes unaware.  Also has some diplopia that resolves when one eye is covered up.  Generalized weakness and dizziness.  No n/v/d/c, no fevers/chills, no chest pain, no dysuria/polyuria, no rectal bleeding/melena, no numbness/tingling.  Chronic cough at baseline.     Prior Medication List:  BAYER ASPIRIN  325 MG TABS (ASPIRIN ) Take 1 tablet by mouth once a day LIPITOR 80 MG TABS (ATORVASTATIN  CALCIUM ) Take 1/2  tablet by mouth once a day PROTONIX 40 MG TBEC (PANTOPRAZOLE SODIUM) Take 1 tablet by mouth twice a day ALBUTEROL  90 MCG/ACT AERS (ALBUTEROL ) 2 puffs every four hours if needed for trouble breathing METOPROLOL  TARTRATE 50 MG  TABS (METOPROLOL  TARTRATE) take 1 tab by mouth twice daily CALCIUM  500/D 500-400 MG-UNIT  CHEW (CALCIUM -VITAMIN D) one tablet twice a day CENTRUM   CHEW (MULTIPLE VITAMINS-MINERALS)    Current Allergies (reviewed today): ! PENICILLIN  Past Medical History:    Reviewed history from 08/15/2007 and no changes required:       card cath 08/2001 for NSTEMI -- stent RCAx2,        expressive aphasia,      H/o bigeminy/trigeminy        L MCA aneurysm--repaired 1982,       h/o CVA with residual expressive aphasia       h/o diverticulosis and perforation  Past Surgical History:    Reviewed history from 08/15/2007 and no changes required:       carotid dopplers(12/98) nl        mild COPD PFT`s 3/00--mild obstructive dz -       LOA and colostomy takedown 10/07       Infrarenal AAA repair 01/2006   Family History:    Reviewed history from 02/27/2007 and no changes required:       CVA, F--MI, M--aneurysm  Social History:    Reviewed history from 07/19/2008 and no changes required:       Divorced.  Disability due to expressive aphasia from aneurysm repair.  Smokes 1.5-2 ppd.  Patient lives by herself and her daughter helps with finances and transportation.     Review of Systems       As above in HPI   Physical Exam  General:     Did not appear to be in respiratory distress, well nourished,  Head:     Normocephalic and atraumatic without obvious abnormalities.  Eyes:     No corneal or conjunctival inflammation noted. EOMI. Perrl, no nystagmus, good conjugate gaze Ears:     External ear exam shows no significant lesions or deformities.  Otoscopic examination reveals normal TM on right, cerumen obstructing view on left.  No discharge. Hearing is grossly normal bilaterally. Nose:     External nasal examination shows no deformity or inflammation. Mouth:     Oral mucosa and oropharynx without lesions or exudates. Lungs:     Normal respiratory effort, chest expands symmetrically. Course sounds bilaterally, no wheeze appreciated. Heart:     Normal rate and regular rhythm. S1 and S2 normal without gallop, murmur, click, rub or other extra sounds. Abdomen:     Multiple healed scars, soft, NT/ND, +BS Pulses:     R and L carotid,radial,dorsalis pedis and posterior tibial pulses are full and equal bilaterally Extremities:     No clubbing, cyanosis, edema, or deformity  noted. Neurologic:     weak facial muscles bilaterally.  R hemiparesis.  expressive aphasia.  reflexes even bilaterally. Difficulty understanding and performing orders. Additional Exam:     CT Head: Negative for acute changes.  Signs of prior left MCA stroke with compensatory ventricular enlargement. CBC: Normal BMET: Normal ISTAT cardiac enzymes negative x1 CXR Chronic interstitial changes, stable CXR     Impression & Recommendations:  Problem # 1:  ? of SEIZURE DISORDER, COMPLEX PARTIAL (ICD-780.39) Admit to SDU.  Will hold on starting dilantin load at this time.  Plan to have neurology come see the patient for EEG and recommendations.  Ativan as needed.    Problem # 2:  COPD (ICD-496) Will start albuterol  q4h/q2h PRN and atrovent q6h nebs.  Does not seem  to be having a COPD exacerbation so will not start steroids or antibiotics at this time.  Will follow for goal sats of >88%.   Her updated medication list for this problem includes:    Albuterol  90 Mcg/act Aers (Albuterol ) .SABRA... 2 puffs every four hours if needed for trouble breathing   Problem # 3:  CORONARY, ARTERIOSCLEROSIS (ICD-414.00) Will do cardiac enzymes x2 to rule out MI.  ECG in AM   Her updated medication list for this problem includes:    Bayer Aspirin  325 Mg Tabs (Aspirin ) .SABRA... Take 1 tablet by mouth once a day    Metoprolol  Tartrate 50 Mg Tabs (Metoprolol  tartrate) .SABRA... Take 1 tab by mouth twice daily   Problem # 4:  TOBACCO DEPENDENCE (ICD-305.1) smoking cessation consult  Problem # 5:  HYPERCHOLESTEROLEMIA (ICD-272.0) Will hold statin for now.   Her updated medication list for this problem includes:    Lipitor 80 Mg Tabs (Atorvastatin  calcium ) .SABRA... Take 1/2  tablet by mouth once a day  Prophylaxis:  Protonix IV, and SCDs given Hx aneurysm    ]

## 2011-01-30 NOTE — Letter (Signed)
Summary: [facility] -Non Formulary Drug Form  [facility] -Non Formulary Drug Form   Imported By: Clydell Hakim 06/28/2010 15:00:13  _____________________________________________________________________  External Attachment:    Type:   Image     Comment:   External Document

## 2011-01-30 NOTE — Progress Notes (Signed)
Summary: refill   Phone Note Refill Request Call back at Home Phone [phone] Message from:  daughter-Linda  Refills Requested: Medication #1:  LIPITOR 80 MG TABS Take 1/2  tablet by mouth once a day [facility]   Initial call taken by: De Nurse,  December 08, 2010 11:56 AM  Follow-up for Phone Call        will ask preceptor if ok to send in for Dr. Lula Olszewski. Looks like last refill was not from this office. Follow-up by: Theresia Lo RN,  December 08, 2010 12:15 PM  Additional Follow-up for Phone Call Additional follow up Details #1::        Yes ok thanks  Additional Follow-up by: Pearlean Brownie MD,  December 08, 2010 2:29 PM    Prescriptions: LIPITOR 80 MG TABS (ATORVASTATIN CALCIUM) Take 1/2  tablet by mouth once a day  #30.0 Each x 2   Entered by:   Theresia Lo RN   Authorized by:   Ardyth Gal MD   Signed by:   Theresia Lo RN on 12/08/2010   Method used:   Electronically to        [facility] [address] [id number] (retail)       [address] [place], [place]  [address]       Ph: [phone]       Fax: [phone]   RxID:   [id number]

## 2011-01-30 NOTE — Miscellaneous (Signed)
SummaryChief Strategy Officer for supplies from [employer] [facility] completed.

## 2011-01-30 NOTE — Progress Notes (Signed)
Summary: calling regarding Carotid Doppler   Phone Note Call from Patient Call back at Home Phone [phone]   Caller: Daughter/ [name] Summary of Call: Pt daughter calling regarding Carotid Doppler Initial call taken by: Judie Grieve,  October 16, 2010 1:49 PM  Follow-up for Phone Call        pt hasn't had carotid since 1998, will order Meredith Staggers, RN  October 16, 2010 5:12 PM

## 2011-01-30 NOTE — Miscellaneous (Signed)
Summary: prior auth   Clinical Lists Changes pa for budesonide to pcp chart box.Golden Circle RN  June 20, 2010 11:40 AM  once again completed with fact that: patient has COPD (ICD 496) and Stroke (436) which does not allow her to coordinate inhalation with giving of an MDI/HFA inhaler. she needs nebulized treatments of steroids.    I have asked for approval for both 0.25 and 0.5 doses as pharmacy often doesn't have stock of one or the other at a given time.    faxed in. Ancil Boozer  MD  June 20, 2010 1:45 PM

## 2011-01-30 NOTE — Assessment & Plan Note (Signed)
Summary: f/up,tcb   Vital Signs:  Patient profile:   66 year old female Height:      62 inches Weight:      134.3 pounds BMI:     24.65 O2 Sat:      96 % on 2 L/min Temp:     97.8 degrees F oral Pulse rate:   73 / minute BP sitting:   122 / 76  (left arm) Cuff size:   regular  Vitals Entered By: Garen Grams LPN (June 20, 2010 4:22 PM)  O2 Flow:  2 L/min CC: f/u breathing Is Patient Diabetic? No Pain Assessment Patient in pain? no        Primary Care Provider:  Ancil Boozer  MD  CC:  f/u breathing.  History of Present Illness: breathing: overall doing well. continues to use her O2.  without it she will have gasping episodes at times so she is definately benefitting from O2 use.  she uses her portable system regularly including at home.  she continues to take her nebulized treatments as prescribed but due to shortages in the dosage she typically uses we are going to have to change.  also her insurance company is again fighting covering nebulized therapies for ms [place] despite documentation that she struggles with an MDI/HFA due to coordination problems after her stroke.   she continues to smoke.    in just 2 labs around part of [facility] clinic without oxygen she dropped her O2 to 89 %.  we chose at this point to put her back on her o2 at which point she could walk around 2 laps at 96% and with less shortness of breath.  she reports in trials at home without oxygen if she goes into a store in public without it she will get so short of breath they will have to go to the car to get it.  at times she even has her extremities turn blue in color.  this is rare however that she attempts this.   Habits & Providers  Alcohol-Tobacco-Diet     Tobacco Status: current     Tobacco Counseling: to quit use of tobacco products  Current Medications (verified): 1)  Bayer Low Strength 81 Mg Tbec (Aspirin) .Marland Kitchen.. 1 By Mouth Once Daily 2)  Lipitor 80 Mg Tabs (Atorvastatin Calcium) .... Take 1/2  Tablet  By Mouth Once A Day 3)  Albuterol Sulfate 1.25 Mg/10ml Nebu (Albuterol Sulfate) .Marland Kitchen.. 1 Nebulizer Treatment 3 or 4 Times Daily 4)  Metoprolol Tartrate 50 Mg  Tabs (Metoprolol Tartrate) .... Take 1 Tab By Mouth Once Daily 5)  Calcium 500/d 500-400 Mg-Unit  Chew (Calcium-Vitamin D) .... One Tablet Twice A Day 6)  Centrum   Chew (Multiple Vitamins-Minerals) .... One Daily 7)  Budesonide 0.25 Mg/57ml Susp (Budesonide) .Marland Kitchen.. 1 Vial Nebulized Two Times A Day For Copd/asthma Until Old Dose Is Back in [facility]. 8)  Keppra 500 Mg Tabs (Levetiracetam) .... Take One Tablet By Mouth Twice Daily. 9)  Proventil Hfa 108 (90 Base) Mcg/act Aers (Albuterol Sulfate) .... 2 Puffs Q4hr As Needed.  Has Diffcult Time Using.  Primarily Uses Nebs  Allergies (verified): 1)  ! Penicillin  Past History:  Past medical, surgical, family and social histories (including risk factors) reviewed for relevance to current acute and chronic problems.  Past Medical History: Reviewed history from 10/23/2009 and no changes required. CAD......08/2001  NSTEMI -- stent RCAx2  Bigeminy and trigeminy  with MI  2002 L MCA aneurysm--repaired 1982, h/o CVA with  residual expressive aphasia diverticulosis and perforation...colectomy... colostomy.... colostomy repair seizure disorder dx 08/2008 - followed by [facility].Marland KitchenMarland KitchenMarland KitchenHickling  COPD ...severe..- O2 dependent 24 hours AAA....stent repair February 2 007 Tobacco abuse EF...70%.. echo... 2007 Dyslipidemia  Past Surgical History: Reviewed history from 06/13/2009 and no changes required. Severe COPD PFTs - Severe obstruction- Lung Age  41/2009 LOA and colostomy takedown 10/07 Infrarenal AAA repair 01/2006 Bilateral ankle repairs Clips and plates after brain aneurysm - cannot get MRI as a result.   Family History: Reviewed history from 06/13/2009 and no changes required. F--MI?,  died of old age.  some prostate problems M--died of cancer in her 69s, unknown type Daughter - asthma Son -  crohns disease  Social History: Reviewed history from 06/13/2009 and no changes required. Divorced.  Disability due to expressive aphasia from aneurysm repair.  Smokes 1-2 ppd.  Patient lives by herself and her daughter [name]) helps with finances and transportation.   Review of Systems       per HPI  Physical Exam  General:  alert, well-hydrated, and overweight-appearing. pleasant.  smiling.  VS noted and WNL Lungs:  CTAB.  distant sounds. Heart:  normal rate and regular rhythm.  distant sounds.   Impression & Recommendations:  Problem # 1:  COPD (ICD-496) Assessment Unchanged  remains severe.  still using O2 as directed with her portable system 24/7.  she continues to benefit from O2 use in addition to her nebulized medications.  no changes to regimen.  again encouraged smoking cessation - she remains uninterested.    Her updated medication list for this problem includes:    Albuterol Sulfate 1.25 Mg/58ml Nebu (Albuterol sulfate) .Marland Kitchen... 1 nebulizer treatment 3 or 4 times daily    Budesonide 0.25 Mg/61ml Susp (Budesonide) .Marland Kitchen... 1 vial nebulized two times a day for copd/asthma until old dose is back in stock.    Proventil Hfa 108 (90 Base) Mcg/act Aers (Albuterol sulfate) .Marland Kitchen... 2 puffs q4hr as needed.  has diffcult time using.  primarily uses nebs  Orders: [facility]- Est Level  3 (33295)  Problem # 2:  TOBACCO ABUSE (ICD-305.1) Assessment: Unchanged  Orders: [facility]- Est Level  3 (18841)  The patient is strongly urged to stop smoking. she doesn't have the desire or will to do so.  reminded her to take off oxygen when smoking a cigarette to avoid burns.  Complete Medication List: 1)  Bayer Low Strength 81 Mg Tbec (Aspirin) .Marland Kitchen.. 1 by mouth once daily 2)  Lipitor 80 Mg Tabs (Atorvastatin calcium) .... Take 1/2  tablet by mouth once a day 3)  Albuterol Sulfate 1.25 Mg/76ml Nebu (Albuterol sulfate) .Marland Kitchen.. 1 nebulizer treatment 3 or 4 times daily 4)  Metoprolol Tartrate 50 Mg Tabs (Metoprolol  tartrate) .... Take 1 tab by mouth once daily 5)  Calcium 500/d 500-400 Mg-unit Chew (Calcium-vitamin d) .... One tablet twice a day 6)  Centrum Chew (Multiple vitamins-minerals) .... One daily 7)  Budesonide 0.25 Mg/18ml Susp (Budesonide) .Marland Kitchen.. 1 vial nebulized two times a day for copd/asthma until old dose is back in stock. 8)  Keppra 500 Mg Tabs (Levetiracetam) .... Take one tablet by mouth twice daily. 9)  Proventil Hfa 108 (90 Base) Mcg/act Aers (Albuterol sulfate) .... 2 puffs q4hr as needed.  has diffcult time using.  primarily uses nebs  Patient Instructions: 1)  Please follow up in 3 months or of course sooner if needed.

## 2011-01-30 NOTE — Assessment & Plan Note (Signed)
Summary: Q2V      Allergies Added:   Visit Type:  Follow-up Primary Provider:  [facility]  CC:  CAD.  History of Present Illness: The patient is seen for followup of coronary artery disease.  She is doing well.  She does not have any significant chest pain.  She had a non-STEMI in 2002.  Stents were placed to her right coronary artery.  She has not had exercise testing since that time.  We know that her left ventricular function remains normal by echo in 2007.  She's not having chest pain or shortness of breath.  She had a left cerebral aneurysm repair in 1982.  She also has had some seizures that are well treated.  She continues to smoke.  She has severe oxygen-dependent COPD.  Current Medications (verified): 1)  Bayer Low Strength 81 Mg Tbec (Aspirin) .Marland Kitchen.. 1 By Mouth Once Daily 2)  Lipitor 80 Mg Tabs (Atorvastatin Calcium) .... Take 1/2  Tablet By Mouth Once A Day 3)  Albuterol Sulfate 1.25 Mg/56ml Nebu (Albuterol Sulfate) .Marland Kitchen.. 1 Nebulizer Treatment 3 or 4 Times Daily 4)  Metoprolol Tartrate 50 Mg  Tabs (Metoprolol Tartrate) .... Take 1 Tab By Mouth Once Daily 5)  Calcium 500/d 500-400 Mg-Unit  Chew (Calcium-Vitamin D) .... One Tablet Twice A Day 6)  Centrum   Chew (Multiple Vitamins-Minerals) .... One Daily 7)  Budesonide 0.25 Mg/68ml Susp (Budesonide) .Marland Kitchen.. 1 Vial Nebulized Two Times A Day For Copd/asthma Until Old Dose Is Back in [place]. 8)  Keppra 500 Mg Tabs (Levetiracetam) .... Take One Tablet By Mouth Twice Daily. 9)  Proventil Hfa 108 (90 Base) Mcg/act Aers (Albuterol Sulfate) .... 2 Puffs Q4hr As Needed.  Has Diffcult Time Using.  Primarily Uses Nebs  Allergies (verified): 1)  ! Penicillin  Past History:  Past Medical History: CAD......08/2001  NSTEMI -- stent RCAx2  Bigeminy and trigeminy  with MI  2002 L MCA aneurysm--repaired 1982, h/o CVA with residual expressive aphasia diverticulosis and perforation...colectomy... colostomy.... colostomy repair seizure  disorder dx 08/2008 - [facility] by [facility].Marland KitchenMarland KitchenMarland KitchenHickling  COPD ...severe..- O2 dependent 24 hours AAA....stent repair February 2 007 Tobacco abuse EF...70%.. echo... 2007 Dyslipidemia Carotid bruit   October, 2011  Review of Systems       Patient denies fever, chills, headache, sweats, rash, change in vision, change in hearing, chest pain, nausea vomiting, urinary symptoms. She does have a cough from her COPD.   All other systems are reviewed and are negative.  Vital Signs:  Patient profile:   66 year old female Height:      62 inches Weight:      132 pounds BMI:     24.23 Pulse rate:   64 / minute BP sitting:   132 / 76  (left arm) Cuff size:   regular  Vitals Entered By: Hardin Negus, RMA (October 11, 2010 11:24 AM)  Physical Exam  General:  The patient is stable.  She is wearing her home oxygen. Head:  head is atraumatic. Eyes:  no xanthelasma. Neck:  no jugular venous distention. Chest Wall:  no chest wall tenderness. Lungs:  lung sounds are distant.  There is no respiratory distress. Heart:  cardiac exam reveals S1-S2.  There are no clicks or significant murmurs. Abdomen:  abdomen is soft. Msk:  patient has kyphosis of the spine. Extremities:  There is no peripheral edema. Skin:  no skin rashes. Psych:  patient is oriented to person time and place.  Affect is normal.  Impression & Recommendations:  Problem # 1:  * CAROTID BRUIT There is question of a soft carotid bruit.  I have reviewed all of our records and the hospital records and I cannot find a report of a carotid Doppler.  She may have had it in another setting.  Her daughter will be looking through her records.  Certainly one should be done if she has not had one in the past years.  Problem # 2:  TOBACCO ABUSE (ICD-305.1) I talked with the patient about her smoking.  I strongly encouraged her to stop.  Problem # 3:  ABDOMINAL AORTIC ANEURYSM REPAIR, HX OF (ICD-V15.1) The patient had her abdominal  aortic aneurysm repair with a stent graft in 2007.  She is doing well.  No further workup.  Problem # 4:  COPD (ICD-496)  Her updated medication list for this problem includes:    Albuterol Sulfate 1.25 Mg/61ml Nebu (Albuterol sulfate) .Marland Kitchen... 1 nebulizer treatment 3 or 4 times daily    Budesonide 0.25 Mg/59ml Susp (Budesonide) .Marland Kitchen... 1 vial nebulized two times a day for copd/asthma until old dose is back in stock.    Proventil Hfa 108 (90 Base) Mcg/act Aers (Albuterol sulfate) .Marland Kitchen... 2 puffs q4hr as needed.  has diffcult time using.  primarily uses nebs COPD is severe.  Of course stopping smoking would be the best therapy for her.  Problem # 5:  CVA (ICD-436) The patient is stable after the CVA she had many years ago.  She still has a mild expressive aphasia that is subtle.  Problem # 6:  SEIZURE DISORDER, COMPLEX PARTIAL (ICD-780.39)  Her updated medication list for this problem includes:    Keppra 500 Mg Tabs (Levetiracetam) .Marland Kitchen... Take one tablet by mouth twice daily. Her seizures are under good control.  No change in therapy.  Problem # 7:  CORONARY, ARTERIOSCLEROSIS (ICD-414.00)  Her updated medication list for this problem includes:    Bayer Low Strength 81 Mg Tbec (Aspirin) .Marland Kitchen... 1 by mouth once daily    Metoprolol Tartrate 50 Mg Tabs (Metoprolol tartrate) .Marland Kitchen... Take 1 tab by mouth once daily  Orders: EKG w/ Interpretation (93000) Coronary disease is stable.  EKG is done today and reviewed by me.  It was normal.  No further workup is needed.  Problem # 8:  HYPERCHOLESTEROLEMIA (ICD-272.0)  Her updated medication list for this problem includes:    Lipitor 80 Mg Tabs (Atorvastatin calcium) .Marland Kitchen... Take 1/2  tablet by mouth once a day Lipids are being treated.  No change in therapy.  I will see her back in 12 months.  We will be checking about the possibility of a carotid Doppler.   Patient Instructions: 1)  Please check through your records and give me a call to let me know about a  Carotid ultrasound, [phone] Ethel Rana. 2)  Your physician wants you to follow-up in:  1 year.  You will receive a reminder letter in the mail two months in advance. If you don't receive a letter, please call our office to schedule the follow-up appointment.

## 2011-01-30 NOTE — Miscellaneous (Signed)
Summary: med change  [facility] doe not have 5mg  budesonide in stock but does have 2.5mg .  this is the only preferred neb so will change to 2.5mg  neb until they can get 5mg  neb in stock. Ancil Boozer  MD  June 15, 2010 10:43 AM   Medications Added BUDESONIDE 0.25 MG/2ML SUSP (BUDESONIDE) 1 vial nebulized two times a day for copd/asthma until old dose is back in stock.       Clinical Lists Changes  Medications: Changed medication from PULMICORT 0.5 MG/2ML SUSP (BUDESONIDE) one inhalation once daily for COPD/Asthma to BUDESONIDE 0.25 MG/2ML SUSP (BUDESONIDE) 1 vial nebulized two times a day for copd/asthma until old dose is back in stock. - Signed Rx of BUDESONIDE 0.25 MG/2ML SUSP (BUDESONIDE) 1 vial nebulized two times a day for copd/asthma until old dose is back in stock.;  #60 x 1;  Signed;  Entered by: Ancil Boozer  MD;  Authorized by: Ancil Boozer  MD;  Method used: Electronically to [facility]. [phone]*, [address] [place], [place], [place]  [address], Ph: [phone], Fax: [phone]    Prescriptions: BUDESONIDE 0.25 MG/2ML SUSP (BUDESONIDE) 1 vial nebulized two times a day for copd/asthma until old dose is back in stock.  #60 x 1   Entered and Authorized by:   Ancil Boozer  MD   Signed by:   Ancil Boozer  MD on 06/15/2010   Method used:   Electronically to        [facility]. [phone]* (retail)       [address]       [place], [place]  [address]       Ph: [phone]       Fax: [phone]   RxID:   [id number]

## 2011-01-30 NOTE — Progress Notes (Signed)
Summary: Change Practice ---- GI   Phone Note Outgoing Call Call back at Home Phone [phone]   Call placed by: Harlow Mares CMA [facility]),  January 12, 2010 4:08 PM Call placed to: Patient Summary of Call: patient states that she already has her colonoscopy scheduled with another MD. I had Yesi put a note in IDX that the patient transfered care.  Initial call taken by: Harlow Mares CMA [facility]),  January 12, 2010 4:09 PM

## 2011-02-01 NOTE — Miscellaneous (Signed)
   Clinical Lists Changes  Problems: Removed problem of SPECIAL SCREENING FOR MALIGNANT NEOPLASMS COLON (ICD-V76.51) Removed problem of SPECIAL SCREENING FOR OSTEOPOROSIS (ICD-V82.81) Removed problem of SCREENING FOR MALIGNANT NEOPLASM OF THE CERVIX (ICD-V76.2) Removed problem of PREVENTIVE HEALTH CARE (ICD-V70.0) Observations: Added new observation of LLIMPORTMEDS: completed (12/26/2010 9:23)

## 2011-02-01 NOTE — Letter (Signed)
Summary: Generic Letter  [facility]  [address]   [place], [place] [address]   Phone: [phone]  Fax: [phone]    01/25/2011  [phone] [place], [place]  [address]  Dear [name] [name],  We are happy to let you know that since you are covered under Medicare you are able to have a FREE visit at the [facility] to discuss your HEALTH. This is a new benefit for Medicare.  There will be no co-payment.  At this visit you will meet with Arlys John an expert in wellness and the health coach at our clinic.  At this visit we will discuss ways to keep you healthy and feeling well.  This visit will not replace your regular doctor visit and we cannot refill medications.     You will need to plan to be here at least one hour to talk about your medical history, your current status, review all of your medications, and discuss your future plans for your health.  This information will be entered into your record for your doctor to have and review.  If you are interested in staying healthy, this type of visit can help.  Please call the office at: [phone], to schedule a "Medicare Wellness Visit".  The day of the visit you should bring in all of your medications, including any vitamins, herbs, over the counter products you take.  Make a list of all the other doctors that you see, so we know who they are. If you have any other health documents please bring them.  We look forward to helping you stay healthy.  Sincerely,   [name]  iAWV

## 2011-03-09 ENCOUNTER — Ambulatory Visit (INDEPENDENT_AMBULATORY_CARE_PROVIDER_SITE_OTHER): Payer: Medicare Other | Admitting: Family Medicine

## 2011-03-09 ENCOUNTER — Encounter: Payer: Self-pay | Admitting: Family Medicine

## 2011-03-09 VITALS — BP 108/64 | Temp 97.4°F | Ht 60.5 in | Wt 132.6 lb

## 2011-03-09 DIAGNOSIS — I2510 Atherosclerotic heart disease of native coronary artery without angina pectoris: Secondary | ICD-10-CM

## 2011-03-09 DIAGNOSIS — I252 Old myocardial infarction: Secondary | ICD-10-CM

## 2011-03-09 DIAGNOSIS — D649 Anemia, unspecified: Secondary | ICD-10-CM

## 2011-03-09 DIAGNOSIS — F17200 Nicotine dependence, unspecified, uncomplicated: Secondary | ICD-10-CM

## 2011-03-09 DIAGNOSIS — R569 Unspecified convulsions: Secondary | ICD-10-CM

## 2011-03-09 DIAGNOSIS — J449 Chronic obstructive pulmonary disease, unspecified: Secondary | ICD-10-CM

## 2011-03-09 DIAGNOSIS — E7800 Pure hypercholesterolemia, unspecified: Secondary | ICD-10-CM

## 2011-03-09 DIAGNOSIS — Z7189 Other specified counseling: Secondary | ICD-10-CM

## 2011-03-09 LAB — CONVERTED CEMR LAB
Direct LDL: 83 mg/dL
RBC: 4.49 M/uL (ref 3.87–5.11)
Vitamin B-12: 664 pg/mL (ref 211–911)
WBC: 8.6 10*3/uL (ref 4.0–10.5)

## 2011-03-09 LAB — CBC
MCV: 98 fL (ref 78.0–100.0)
Platelets: 255 10*3/uL (ref 150–400)
RBC: 4.49 MIL/uL (ref 3.87–5.11)
WBC: 8.6 10*3/uL (ref 4.0–10.5)

## 2011-03-09 LAB — LDL CHOLESTEROL, DIRECT: Direct LDL: 83 mg/dL

## 2011-03-09 MED ORDER — ALBUTEROL SULFATE HFA 108 (90 BASE) MCG/ACT IN AERS: 2.0000 | INHALATION_SPRAY | RESPIRATORY_TRACT | Status: DC | PRN

## 2011-03-09 NOTE — Patient Instructions (Signed)
Please make an appointment to see our pharmacist for pulmonary function testing. I will call you with the results. Please look over the papers regarding the advance directives.  I will also call your daughter with the lab results.  Please schedule mammogram soon.  Please also schedule a follow-up appointment with your Neurologist and ask him to please send me a copy of the office visit note.  Please follow-up with me in 3 months.

## 2011-03-10 LAB — VITAMIN B12: Vitamin B-12: 664 pg/mL (ref 211–911)

## 2011-03-11 ENCOUNTER — Encounter: Payer: Self-pay | Admitting: Family Medicine

## 2011-03-11 DIAGNOSIS — Z7189 Other specified counseling: Secondary | ICD-10-CM | POA: Insufficient documentation

## 2011-03-11 NOTE — Assessment & Plan Note (Signed)
Controlled on statin. Continue.

## 2011-03-11 NOTE — Assessment & Plan Note (Signed)
May be worsening based on daughter's subjective impression. Will check PFTs. No changes to medications for now.

## 2011-03-11 NOTE — Assessment & Plan Note (Signed)
Stable. No seizures and neurological/psychologic status stable. Daughter will make follow-up appointment with Dr. Sharene Skeans (patient due for one). Last visit about 6 months ago. Continue Keppra.

## 2011-03-11 NOTE — Progress Notes (Signed)
  Subjective:    Patient ID: [name] [name], female    DOB: 1945/10/14, 66 y.o.   MRN: [id number]  HPI 1. Hypoxia Daughter concerned patient more difficulty breathing with activity. Turns blue around lips. Hands feel cold.  Patient long-time smoker. Not interested in quitting.  Uses 2L O2 at home. Sating 90% on RA here in the office.  ROS: denies fever. Uses albuterol nebs occasionally. Pulmicort regularly.   2. Mental status No recent changes. No seizures. Still independent. Lives alone but daughter nearby and regularly helps care for her.  No Advance Directives done yet.  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 No new changes with this.    Review of Systems     Objective:   Physical Exam  Constitutional: She appears well-developed and well-nourished.       Heavy smell of tobacco.   HENT:  Head: Normocephalic and atraumatic.  Eyes: Conjunctivae are normal. Right eye exhibits no discharge.  Neck: Normal range of motion. Neck supple.       No carotid bruits.   Cardiovascular: Normal rate, regular rhythm, normal heart sounds and intact distal pulses.   No murmur heard. Pulmonary/Chest: Effort normal and breath sounds normal. No respiratory distress. She has no rales.       Decreased breath sounds throughout.  No retractions or other signs of respiratory distress. On RA throughout interview. No signs of hypoxia.   Musculoskeletal: She exhibits no edema.  Neurological: She is alert. She displays no tremor. No cranial nerve deficit. Coordination normal.       Sometimes refers to daughter for questions. But appropriate to questions when patient answers.  Gait: walks with cane. But steady and normal gait otherwise, except may be mildly slow gait.   Skin:       Cool extremities.  Bruising on right hand.  Fragile skin.   Psychiatric: She has a normal mood and affect. Her behavior is normal. Judgment and thought content normal.          Assessment &  Plan:

## 2011-03-11 NOTE — Assessment & Plan Note (Signed)
Stable. No recent episodes chest pain. Next Cards appointment 10/2011. Consider decreasing metoprolol to 25mg  with her low blood pressure? Will ask Cards for last OV note.

## 2011-03-11 NOTE — Assessment & Plan Note (Signed)
Patient and daughter given written information and forms. Asked them to bring at next visit in 3 months.

## 2011-03-11 NOTE — Assessment & Plan Note (Signed)
Not anemic

## 2011-03-11 NOTE — Assessment & Plan Note (Signed)
Counseled about smoking but no desire at all to quit. Explained how contributing to COPD and only expects respiratory problems to worsen.

## 2011-03-15 ENCOUNTER — Telehealth: Payer: Self-pay | Admitting: Family Medicine

## 2011-03-15 NOTE — Telephone Encounter (Signed)
BP low at patient's last visit with me several days ago. Called and informed patient's daughter who helps take care of her that I wanted patient to halve current metoprolol dose, so she'll be getting 25mg  a day.  Asked patient's daughter to call back and let me know she received and understands my message.

## 2011-03-19 ENCOUNTER — Telehealth: Payer: Self-pay | Admitting: Family Medicine

## 2011-03-19 NOTE — Telephone Encounter (Signed)
Please call Ms. Fletchall daughter regarding of labs and to give info for appt to have pfts performed.  Metoprolol have been cut in have to dispense to pt.

## 2011-03-21 NOTE — Telephone Encounter (Signed)
Asked patient's daughter to make appointment for patient with Dr. Michaelyn Barter for PFTs. Patient is now talking half of previous metoprolol dose. Asked to make appointment with me or cardiologist in 1 month (whichever is most convenient).

## 2011-04-19 ENCOUNTER — Telehealth: Payer: Self-pay | Admitting: Family Medicine

## 2011-04-19 NOTE — Telephone Encounter (Signed)
Wants to know if MD is willing to fill out some disability forms for pt, could fax them over.

## 2011-04-20 ENCOUNTER — Telehealth: Payer: Self-pay | Admitting: Family Medicine

## 2011-04-20 ENCOUNTER — Encounter: Payer: Self-pay | Admitting: Pharmacist

## 2011-04-20 ENCOUNTER — Ambulatory Visit (INDEPENDENT_AMBULATORY_CARE_PROVIDER_SITE_OTHER): Payer: Medicare Other | Admitting: Pharmacist

## 2011-04-20 DIAGNOSIS — F17200 Nicotine dependence, unspecified, uncomplicated: Secondary | ICD-10-CM

## 2011-04-20 DIAGNOSIS — J449 Chronic obstructive pulmonary disease, unspecified: Secondary | ICD-10-CM

## 2011-04-20 NOTE — Assessment & Plan Note (Addendum)
A: Abnormal spirometry- severe obstruction. Estimated lung age 66yrs old.  P: Continue pulmicort and albuterol neb as before. Counseled on proper use of albuterol inhaler in case of emergency.   Duration of encounter: 30 mins. Seen with Clance Boll PharmD and Ivery Quale PharmD Candidate.

## 2011-04-20 NOTE — Progress Notes (Signed)
  Subjective:    Patient ID: [name] [name], female    DOB: 24-Mar-1945, 66 y.o.   MRN: [id number]  HPI  [name] [name] arrives on time this morning with her daughter in good spirits. [name] [name] reports that she is not ready to quit smoking today. Per daughter, [name] [name] is smoking about 2 ppd. She began smoking at age 52. She is on 2L oxygen at all times. She reports being able to walk 1 flight of stairs with mild limitation.  Review of Systems     Objective:   Physical Exam    Right hand appears mildly cyanotic with bruising (O2 sat R index finger 94%, L 97%)    Assessment & Plan:

## 2011-04-20 NOTE — Patient Instructions (Addendum)
1) Lung function test showed severe obstruction. 2) Oxygen test showed good perfusion to hands. 3) Continue to take all medications as before. 4) Follow up with Dr. Madolyn Frieze.

## 2011-04-20 NOTE — Telephone Encounter (Signed)
Patients daughter left form to be filled out for housing assistance.  Please mail when completed.

## 2011-04-20 NOTE — Assessment & Plan Note (Signed)
A: Mod-severe tobacco dependence (currently smoking 2ppd; 100 pack year history). Not ready to quit smoking.  P: Reassess readiness to quit smoking at every visit.

## 2011-04-20 NOTE — Progress Notes (Signed)
  Subjective:    Patient ID: [name] [name], female    DOB: [birth year], 66 y.o.   MRN: [id number]  HPI Reviewed and agree with Dr. Macky Lower management    Review of Systems     Objective:   Physical Exam        Assessment & Plan:

## 2011-04-23 NOTE — Telephone Encounter (Signed)
Hi Erin. Please ask the patients to fax forms over but will you please ask who the forms are for (who is asking for forms), what her disability is from, and what kind of benefits she will get from filing for this? Please notify the patient I will look over forms and fill them out if I am able.  Thank you for your help Denny Peon.

## 2011-04-24 NOTE — Telephone Encounter (Signed)
Just needs a signature.  Placed in Dr. Bluford [place]'s box for completion.

## 2011-04-27 NOTE — Telephone Encounter (Signed)
Completed and placed in outgoing mail today.

## 2011-05-04 ENCOUNTER — Other Ambulatory Visit (INDEPENDENT_AMBULATORY_CARE_PROVIDER_SITE_OTHER): Payer: Medicare Other | Admitting: *Deleted

## 2011-05-04 DIAGNOSIS — E7800 Pure hypercholesterolemia, unspecified: Secondary | ICD-10-CM

## 2011-05-04 MED ORDER — ATORVASTATIN CALCIUM 80 MG PO TABS: ORAL_TABLET | ORAL | Status: DC

## 2011-05-04 NOTE — Telephone Encounter (Signed)
This has been taken care of, closing encounter

## 2011-05-10 ENCOUNTER — Other Ambulatory Visit: Payer: Self-pay | Admitting: Family Medicine

## 2011-05-15 NOTE — Discharge Summary (Signed)
[name] [name], [name] [name]                ACCOUNT NO.:  [id number]   MEDICAL RECORD NO.:  [id number]          PATIENT TYPE:  INP   LOCATION:  2621                         FACILITY:  [facility]   PHYSICIAN:  Leighton Roach McDiarmid, M.D.DATE OF BIRTH:  09/19/1945   DATE OF ADMISSION:  08/31/2008  DATE OF DISCHARGE:  09/03/2008                               DISCHARGE SUMMARY   DISCHARGE DIAGNOSES:  1. New onset simple/complex partial seizures.  2. Chronic obstructive pulmonary disease.  3. Hypertension.  4. Coronary artery disease status post stent.  5. Status post infrarenal abdominal aortic aneurysm repair, February      2007.  6. Tobacco abuse.  7. Gastroesophageal reflux disease.   CONSULTATIONS:  1. Neurology.  2. Speech Therapy.   PROCEDURES AND STUDIES:  1. EEG.  Impression:  Abnormal EEG with diffuse background slowing      with associated left focal slowing.  No seizure activity during      study.  2. Chest x-ray, August 31, 2008.  Impression:  Chronic interstitial      lung changes.  Stable chest x-ray.  3. CT of head without contrast.  Impression:  Remote left middle      cerebral artery distribution infarct, compensatory ventricular      dilatation, no acute intracranial hemorrhage or edema.  4. Chest x-ray, September 02, 2008.  Impression:  Segmental atelectasis      and/or scarring at the bases negative for pneumonia or aspiration.  5. EKG.   ADMISSION LABORATORY DATA:  Cardiac enzymes i-STAT negative x1.  Cardiac  enzymes negative x1.  CBC:  Hemoglobin 16.0 and hematocrit 48.1.  I-STAT  blood gas:  PH 7.4, pCO2 42.3, pO2 60, and bicarb 26.9.   DISCHARGE LABORATORY DATA:  CBC:  Hemoglobin 14.9, hematocrit 44.3, and  platelets 228.  BMP:  Sodium of 142, potassium 3.7, glucose of 127, BUN  of 8, creatinine of 0.99, and calcium 8.9.   BRIEF HISTORY AND PHYSICAL:  This is a 66 year old Caucasian female with  remote history of left MCA aneurysm repair with residual expressive  aphasia and right hemiparesis, CAD status post stenting and COPD, who  presented after episode of labored breathing, decreased consciousness  with right arm twitching and change in speech which lasted approximately  5 minutes.  The patient had recurrent episodes in the ED, approximately  a total of 5 episodes which were similar to initial episode witnessed at  home and 2 of the episodes which were actually witnessed by a nurse and  emergency department physician.  1. Seizure disorder.  After witnessed episodes of seizure-like      activity with decreased consciousness, the patient was given Ativan      x2 in the ED and transferred to Step-down Unit.  The patient did      not have any other episodes of seizure-like activity status post      Ativan.  Neurology was consulted and the patient was started on      Keppra 250 mg after EEG showed diffuse slowing.  Seizure activity      most likely has  a nidus from the history of left MCA aneurysm      repair.  After initial dose of Keppra approximately 2-3 hours      later, the patient had an episode of abnormal behavior with      increased irritability and confusion as best as well as the urinary      incontinence.  No focal seizure activity was noted by RN staff.      Possibility was side effect due to Keppra, however, after      consultation with Neurology, side effects from Keppra usually      include changes in mood versus change in actual irritability or      things such as aggressive behavior.  Episode was most likely due to      acute delirium or sundowning effect with new surroundings for the      patient.  The patient was continued on Keppra 250 mg b.i.d. and did      not have any other episodes of acute delirium or seizure activity.      It is of note that after the initial episode of acute delirium, the      patient had cough like/choking episode and was made n.p.o.  Chest x-      ray was obtained which did not show any evidence of  aspiration.      The patient subsequently passed a swallow evaluation and had no      other trouble with p.o. intake.  2. COPD.  The patient has a history of COPD, however, has not been      evaluated with PFTs for adequate COPD regimen.  During inpatient      stay, the patient was placed on 2 L nasal cannula to keep      saturations greater than 93%.  Trial also of O2 resulted in hypoxia      to 87%.  Home Health Care was consulted and the patient was sent      home on oxygen with a liter of flow of 2 L to keep saturations      below 93%.  The patient was also started on Pulmicort and albuterol      nebulizers.  No long-acting beta-2 agonist was given as we could      not find one in nebulizer form.  The patient would need PFTs as an      outpatient and medication management for COPD.  3. Hypertension.  The patient has history of hypertension.  BP was      stable with systolic from 140-160 and diastolic 55-70.  There was      no change in the patient's medication.  We will continue metoprolol      50 mg b.i.d.  It is of note that the patient has history of      coronary artery disease and beta-blocker is indicated.  However,      the patient has had deterioration COPD which could be a result of      beta-blocker uses.  Beta-blocker can cause some      bronchoconstriction.  After reviewing history, the patient has been      on beta-blocker for greater than 5 years, however, documentation of      use of thiazide or ACE was not shown.  The patient will continue to      follow with Cardiology and PCP.  The PCP may consider decreasing      beta-blocker with addition of  thiazide or other anti-hypertensives      if the patient continues to have deterioration in COPD.  4. CAD.  The patient has history of coronary artery disease status      post stent x2 and abdominal aortic aneurysm repair.  The patient      will continue on Lipitor 40 mg and aspirin daily.  5. Tobacco abuse.  The patient  receives nicotine patch during      inpatient admission and has no desire to quit smoking.  The patient      was instructed on use of oxygen and need for smoking cessation as      well as the dangers of tobacco use around oxygen tank.  6. GERD.  The patient has a history GERD.  Protonix 40 mg IV was given      initially and the patient was n.p.o. for seizure precautions.  The      patient will continue Protonix 40 mg p.o. b.i.d. as outpatient.   DISCHARGE MEDICATIONS:  1. Protonix 40 mg t.i.d.  2. Metoprolol 50 mg p.o. b.i.d., although the patient states was      taking only 50 mg p.o. daily.  3. Lipitor 40 mg p.o. daily.  4. Aspirin 325 mg p.o. daily, although the patient's states was only      taking aspirin 81 mg p.o. daily.  We will confirm with PCP.  5. Multivitamin Centrum p.o. daily.  6. Viactiv 2 chewables daily.  7. Keppra 250 mg p.o. daily, which will need to be titrated to 750 mg      p.o. daily with an increase in 250 mg weekly to achieve that      maximum dose of 750 mg.  8. O2 at 2 L flow.  9. Albuterol nebulizers q.4 h. as needed for wheezing and difficulty      breathing.  10.Pulmicort Respules nebulizer, 1 nebulizer treatment daily.   DISCHARGE INSTRUCTIONS:  The patient is to return for evaluation indeed  if there is evidence of chest pain, difficulty breathing, or  uncontrolled seizures.  The patient is to follow up with PCP for PFTs  study, for COPD, and for titration of Keppra to therapeutic dose.   ISSUES FOR FOLLOWUP:  Keppra titration to therapeutic dose.  Confirmation and amount of metoprolol to be taken daily.      Milinda Antis, MD       Etta Grandchild, M.D.     KD/MEDQ  D:  09/05/2008  T:  09/05/2008  Job:  [id number]   cc:   Ancil Boozer, MD  Deanna Artis. Sharene Skeans, M.D.

## 2011-05-15 NOTE — Consult Note (Signed)
[name] [name], [name] NO.:  [id number]   MEDICAL RECORD NO.:  [id number]          PATIENT TYPE:  INP   LOCATION:  2621                         FACILITY:  [facility]   PHYSICIAN:  Deanna Artis. Hickling, M.D.DATE OF BIRTH:  [birth year]   DATE OF CONSULTATION:  DATE OF DISCHARGE:                                 CONSULTATION   CHIEF COMPLAINT:  Evaluate for seizure activity.   HISTORY OF PRESENT ILLNESS:  Ms. 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  This required manual clipping of the artery.  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  She has been stable for 27 years.   Yesterday, the patient had a total of 5 episodes that appeared to be  simple/complex partial seizures with left brain signature.  She called  up her daughter and was more incoherent than normal on the phone, and  was taking deep gulping breaths.  Her daughter did  something that was  wrong and offered to call EMS.  The patient refused and daughter decided  to come over to her house to investigate.   En route she talked her mother again by that time, her mother's speech  had improved towards baseline.  This was no more than 10 minutes and  probably only 5 minutes after her mother first called up.  As daughter  arrived at the house, mother again suffered a change in her speech and  at the point that daughter arrived, the patient was agitated, restless,  not able to focus with her eyes, unable to speak coherently, and had  jerking movements in her right arm.  This lasted for no more than about  5 minutes and she returned to baseline about 10 minutes which is the  time which EMS arrived.   The patient was transported to [facility] and no further  seizures recur.   In the hospital  where she had three episodes; one that was unwitnessed  by medical personnel that was very similar to the others, but brief.  Her daughter was not in the room at that time.  The second, daughter was  in the room and she observed the same behaviors noted above and called  the nurse who witnessed the episode.  The third was witnessed both by  the nurse and the emergency department physician.  The patient received  either 3 or 4 mg of Ativan in 2 divided doses for the fourth and fifth  seizures.  She was sleepy, but arousable.  She has not had any further  seizures since that time.   The patient experienced urinary incontinence, but not fecal  incontinence.  She did not bite her tongue.   Her review of systems is unremarkable for fevers, chills, dysuria,  hematuria, contact with ill persons, prior seizure activity, new  medications.  Twelve-system reviewed, otherwise negative.   Past medical history is remarkable for,  1. Coronary artery  disease requiring stenting in the right coronary      artery x2.  2. COPD.  3. Infrarenal abdominal aortic aneurysm, repaired in 2007.  4. History of diverticulosis with perforation requiring hemicolectomy      with colostomy.  5. Hypertension.  6. Gastroesophageal reflux disease.   PAST SURGICAL HISTORY:  1. Colectomy plus colostomy followed by reanastomosis procedure.  2. Middle cerebral artery distribution, clipping.  3. Abdominal aortic aneurysm repair.  4. Coronary artery stenting.  5. She has had two fractured ankles.   CURRENT MEDICATIONS:  1. Aspirin 325 mg daily.  2. Lipitor 80 mg one half tablet daily.  3. Protonix 40 mg twice daily.  4. Albuterol 90 mcg 2 puffs 4 times as needed.  5. Metoprolol 50 mg twice daily.  6. Calcium plus vitamin D daily.  7. Centrum daily.   SOCIAL HISTORY:  The patient smokes 2 packs of cigarettes per day.  She  does not use alcohol or drugs.   FAMILY HISTORY:  Mother died of cancer.  Father died in his  67s of old  age.  Sister is healthy.  There is no family members with stroke.   PHYSICAL EXAMINATION:  GENERAL:  Today, the pleasant woman in no acute  distress.  The patient is an obese, somewhat ill-appearing woman with  frequent coughing, but otherwise no distress.  VITAL SIGNS:  Temperature 98.4, blood pressure 149/85, resting pulse 81,  respirations 20, oxygen saturation 99% on 1 liter.  HEAD, EYES, EARS, NOSE AND THROAT:  No signs of infection.  No bruits.  LUNGS:  Decreased breath sounds, bilateral wheezing.  She is not moving  air very well.  HEART:  No murmurs or gallops.  Regular rate and rhythm.  ABDOMEN:  Soft, protuberant.  Bowel sounds normal.  No  hepatosplenomegaly.  EXTREMITIES:  Increased spasticity on the right side, but tight right  heel cord, clumsiness in the right, fine motor movements.  NEUROLOGIC:  The patient was awake and alert.  She has a significant  expressive dysphasia.  She has difficulty with naming objects.  She has  some problems with understanding commands that I give to her, was able  to follow either by verbal command or by mimic.  Cranial nerves; the  right pupil is reactive, smaller than the left pupil size.  Extraocular  movements are full.  She seems to have full visual fields to  confrontation.  Symmetric facial strength, midline tongue.  Motor  examination; the patient had excellent strength in her arms and her legs  bilaterally.  Fine motor skills were more clumsy in the right side than  the left.  She showed slight right pronator drift.  Sensation was intact  to primary modalities.  She had poor stereoagnosis bilaterally.  Cerebellar examination; good finger-to-nose, rapid alternating  movements.  Gait, quite hemiparetic and slightly unsteady.  Reflexes  showed a right reflex predominance.  The patient has a right extensor,  left flexor, plantar response.   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  This may be an anniversary seizure; however, it has been 27  years since she had her stroke and the review of the CT image shows that  we either have to be a frontal lobe focus or a posterior temporoparietal  focus all of which are possible that seems somewhat unlikely. (345.40,  345.50)  1. The patient has residual  right hemiparesis and mixed aphasia,      expressive greater than receptive secondary to total middle      cerebral artery distribution infarction on the left side.  This      occurred from vasospasm and clipping of the left middle cerebral      artery aneurysm in 1982.   PLAN:  1. The patient needs an antiepileptic medication.  There are 3, at      least 2, first phenytoin can be given intravenously or orally.  It      could be loaded immediately.  The loading dose would be 1000 mg and      maintenance dose 300 mg.  This is associated with a hirsutism,      which was already present and also gingival hyperplasia, she fails      to take care of her teeth.  2. Carbamazepine which is inexpensive, it can be associated liver      dysfunction and blood count problems, which need to be moderate      monthly for the first 6 months.  There is no IV preparation.  There      is a definite enzyme induction of the liver, which may affect the      other medications she takes.  There is a 2-week period to gradually      increase the drugs to prevent toxicity.  3. Levetiracetam which is extensive, comes in oral and IV      preparations, requires a 2 to 3-week titration, is renally      excreted, and so does not affect other medications and will not be      affected by other medications unless she has renal dysfunction.      This is not currently the case.   The best medication over the patient would appear to be levetiracetam.  The best overall medication for its cost, probably is carbamazepine.  Lamotrigine  is another option, but would take 6 weeks to titrate and  therefore would not be among my first choices.   I appreciate the opportunity to participate in her care.  I do not feel  that other workup other than EEG is needed.  Regardless of what shown in  the EEG, the patient is vulnerable for more seizures and should be  treated.  She can go on home.  She should not be left alone for at least  the next 1-2 weeks.  She will not be allowed to smoke in her daughter's  home if she goes there and her daughter feels that she cannot come to  the patient's home because of the residual smoke and problems affecting  her airway as well as that of her daughters.  I appreciate the  opportunity to participate in her care.  We can place the patient on a  NicoDerm patch, but that she did not agree to that at the time.  If you  have questions, do not hesitate to contact me. [phone].      Deanna Artis. Sharene Skeans, M.D.  Electronically Signed     WHH/MEDQ  D:  09/01/2008  T:  09/02/2008  Job:  [id number]   cc:   Nestor Ramp, MD

## 2011-05-15 NOTE — Assessment & Plan Note (Signed)
[facility]                            CARDIOLOGY OFFICE NOTE   NAME:[name], [name] [name] NIKOLOV                       MRN:          [id number]  DATE:09/08/2008                            DOB:          Jan 09, 1945    [name] [name] [name] is stable from the cardiac viewpoint.  Recently, she was  diagnosed having seizures.  She is being followed by Dr. Sharene Skeans.  Her  seizures were not on a cardiac basis.  She has not been having any chest  pain.  She has no major shortness of breath.  It would be helpful for  her to be more active.   PAST MEDICAL HISTORY:   ALLERGIES:  Penicillin.   MEDICATIONS:  1. Lipitor.  2. Aspirin.  3. Keppra (generic equal Levetiracet).  4. Metoprolol.  5. Pulmicort.  6. Viactiv.  7. O2.   OTHER MEDICAL PROBLEMS:  See the list on my note of August 19, 2007.   REVIEW OF SYSTEMS:  She has no complaints other than the HPI.   PHYSICAL EXAMINATION:  VITAL SIGNS:  Weight is up at 161 pounds.  Blood  pressure is 124/72 with a pulse of 60.  SKIN:  Overall, her skin has some mottling.  Etiology is not clear to  me.  She is hemodynamically stable.  HEENT:  No xanthelasma.  She has normal extraocular motion.  NECK:  There are no carotid bruits.  There is no jugular venous  distention.  LUNGS:  Clear.  Respiratory effort is not labored.  CARDIAC:  S1 and S2.  There are no clicks or significant murmurs.  ABDOMEN:  Soft.  She has no peripheral edema.   PROBLEMS:  Problems are listed on the note of August 19, 2007.  1. Coronary disease.  This is stable.  2. Good left ventricular function that was redocumented in January      2007.   The patient's lipids are being treated.  Her cardiac status is stable.  She has a new diagnosis of seizures being treated with Keppra by Dr.  Sharene Skeans.  I will see her 1 year for cardiology followup.     [name] [name] Abed, MD, [facility]  Electronically Signed    JDK/MedQ  DD: 09/08/2008  DT: 09/09/2008  Job #: [id number]   cc:   [name]

## 2011-05-15 NOTE — Assessment & Plan Note (Signed)
[facility]                            CARDIOLOGY OFFICE NOTE   NAME:[name] [name] GOODENOW                       MRN:          [id number]  DATE:08/19/2007                            DOB:          April 14, 1945    SUBJECTIVE:  [name] [name] is stable.  She has a multitude of medical  problems, but is stable.  I had cleared her in August 2007, to have her  next surgical procedure.  She is doing well.  She is not having any  chest pain.  Unfortunately she continues to smoke.  She has cut down but  she continues to smoke, and we had a long discussion about this.   ALLERGIES:  PENICILLIN.   MEDICATIONS:  1. Iron.  2. Propanol.  3. Centrum.  4. Lipitor.  5. Aspirin.   OTHER MEDICAL PROBLEMS:  See below.   REVIEW OF SYSTEMS:  She is feeling well.  She has no significant  problems.  As noted, we reviewed the issue of her smoking at great  length.  Otherwise her review of systems is negative.   PHYSICAL EXAMINATION:  VITAL SIGNS:  Blood pressure 130/70, pulse 65.  GENERAL:  The patient is oriented as to person, time and place.  Her  affect is normal.  She is here with her family member.  HEENT:  No xanthelasma.  She has normal extraocular motions.  NECK:  There are no carotid bruits, no jugular venous distention.  HEART:  S1 and S2.  No clicks or significant murmurs.  ABDOMEN:  Now is soft.  Her scar in her abdomen is nicely healed.  EXTREMITIES:  She has no significant peripheral edema.   PROBLEMS:  1. History of a myocardial infarction in 2002, with intervention of      the right coronary artery and she is stable.  She needs no further      workup.  She needs to stop smoking.  2. Good left ventricular function, re-documented in January 2007.  3. History of ongoing smoking.  See the description above.  4. History of a subarachnoid hemorrhage, treated in the past with left      middle cerebral artery clipping.  5. Hyperlipidemia, and she is on a statin now.  6. History of diverticulosis with sigmoid colon with perforation and a      colectomy and then a colostomy repair.  7. History of an abdominal aortic aneurysm that was repaired with a      stent in February 2007.  8. Diabetes ?.  9. Acute renal failure when hospitalized in the past, that improved.  10.History of a cerebrovascular accident.  11.Some residual expressive aphasia.   Her cardiac status is stable.  I will see her back in one year.     [name] Abed, MD, [facility]  Electronically Signed    JDK/MedQ  DD: 08/19/2007  DT: 08/19/2007  Job #: [id number]   cc:   [facility]

## 2011-05-15 NOTE — Procedures (Signed)
EEG NUMBER:  [id number]   CLINICAL HISTORY:  The patient is a 66 year old admitted on August 28, 2008, for seizures, shortness of breath, decreased responsiveness,  confusion, status post left middle cerebral artery aneurysm repair with  expressive aphasia.  She is a smoker and has chronic cough, coronary  artery disease, cardiac stent, COPD, and hypertension.  Medications  include Ventolin, aspirin, Atrovent, Lopressor, Protonix, and Ativan.  The patient had aphasia and right focal seizure activity (345.50,  345.40).   PROCEDURE:  The tracing is carried out on a 32-channel, digital Cadwell  recorder reformatted into 16-channel montages with 1 devoted to EKG.  The patient was awake and drowsy during the recording.  The  International 10/20 system of lead placement was used.   DESCRIPTION FINDINGS:  Background activity shows 5-6 Hz beta range  activity of about 20 microvolts that is prominent diffusely, better seen  over the right hemisphere than left.  Mixed frequency polymorphic delta  range activity and beta range components were seen over the left  anterior temporal regions throughout the record.  There was no  interictal epileptiform activity in the form of spikes or sharp waves.   IMPRESSION:  Abnormal EEG on the basis of diffuse background slowing  with associated left focal slowing.  This is consistent with the  patient's history of prior stroke.  No seizure activity was seen in this  record.      Deanna Artis. Sharene Skeans, M.D.  Electronically Signed     ZOX:WRUE  D:  09/01/2008 17:35:19  T:  09/02/2008 06:34:10  Job #:  [id number]   cc:   Rodney Langton, MD   Nestor Ramp, MD

## 2011-05-15 NOTE — Discharge Summary (Signed)
[name] [name], [name] [name]                ACCOUNT NO.:  [id number]   MEDICAL RECORD NO.:  [id number]          PATIENT TYPE:  INP   LOCATION:  2621                         FACILITY:  [facility]   PHYSICIAN:  Leighton Roach McDiarmid, M.D.DATE OF BIRTH:  14-Jul-1945   DATE OF ADMISSION:  08/31/2008  DATE OF DISCHARGE:  09/03/2008                               DISCHARGE SUMMARY   DISCHARGE DIAGNOSES:  1. New onset simple/complex partial seizures.  2. Chronic obstructive pulmonary disease.  3. Hypertension.  4. Coronary artery disease, status post stent.  5. Status post infrarenal abdominal aortic aneurysm repair, February      2007.  6. Tobacco abuse.  7. Gastroesophageal reflux disease.   CONSULTATIONS:  1. Neurology.  2. Speech Therapy.   PROCEDURES AND STUDIES:  1. EEG.  Impression, abnormal EEG with diffuse background slowing with      associated left focal swelling.  No seizure activity during study.  2. Chest x-ray August 31, 2008.  Impression, chronic interstitial      lung changes.  Stable chest x-ray.  3. CT of head without contrast.  Impression, remote left middle      cerebral artery distribution infarct, compensatory ventricular      dilatation, no acute intracranial hemorrhage or edema.  4. Chest x-ray September 02, 2008.  Impression, segmental atelectasis      and/or scarring at the bases negative for pneumonia or aspiration.  5. EKG.   ADMISSION LABORATORY DATA:  Cardiac enzymes i-STAT negative x1.  Cardiac  enzymes negative x1.  CBC; hemoglobin 16.0, hematocrit 48.0.  Blood gas;  pH 7.4, pCO2 42.3, pO2 60, bicarb 26.9.   DISCHARGE LABORATORY DATA:  CBC; hemoglobin 14.9, hematocrit 44.3,  platelets 228.  BMP; sodium 142, potassium 3.7, glucose 127, BUN 8,  creatinine 0.99, calcium 8.9.   BRIEF H AND P:  This is a 66 year old Caucasian female with remote  history of left MCA aneurysm repair with residual expressive aphasia and  right hemiparesis, CAD status post stenting, and  COPD who presented  after an episode of labored breathing, decreased consciousness with  right arm twitching and change in speech, which lasted approximately 5  minutes.  The patient had recurrent seizures in the ED approximately a  total of 5 episodes, which were similar to initial episode witnessed at  home and 2 of the episodes which were actually witnessed by a nurse in  emergency department physician.   HOSPITAL COURSE:  1. Seizure disorder.  After witnessed episodes of seizure-like      activity with decreased consciousness, the patient was given Ativan      x2 in the ED and transferred to step-down unit.  The patient did      not have any other episodes of seizure-like activity, status post      Ativan.  Neurology was consulted and the patient was started on      Keppra 250 mg after EEG showed diffuse slowing.  Seizure activity      most likely had a nidus from the history of left MCA aneurysm      repair.  After initial dose of Keppra approximately 2-3 hours      later, the patient had an episode of abnormal behavior with      increased irritability and confusion as well as episode of urinary      incontinence.  No focal seizure activity was noted by RN staff.      There is a possibility that this was a side effect due to Keppra;      however, after consultation with Neurology, side effects on Keppra      usually include changes in mood versus change in actual      irritability or being such as aggressive behavior.  Episode was      most likely due to acute delirium or sundowning effect with new      surroundings for the patient.  The patient was continued on Keppra      250 mg b.i.d. and did not have any episodes of acute delirium or      seizure activity.  It is of note that after the initial episode of      acute delirium, the patient had cough-like/choking episode and was      made n.p.o.  Chest x-ray was obtained which did not show any      evidence of aspiration.  The  patient subsequently passed a      swallowing evaluation and had no other trouble with p.o. intake.  2. COPD.  The patient had a history of COPD, however, has not been      evaluated with PFTs for adequate COPD regimen.  During inpatient      stay, the patient was placed on 2-liter nasal cannula to keep      saturations greater than 93%.  Trial off of O2 resulted in hypoxia      to 87%.  Home health was consulted and the patient was sent home on      oxygen with a liter flow of 2 liters to keep saturations greater      than 93%.  The patient was also started on Pulmicort and albuterol      nebulizers.  No long-acting beta-2 agonist was given as one cannot      be found on nebulizer form.  The patient would will need PFTs as an      outpatient and medication management for COPD.  3. Hypertension.  The patient has history of hypertension.  Blood      pressure was stable with systolic ranging from 140-160, diastolic      55-70.  There was no change in patient's medication.  We will      continue metoprolol 50 mg b.i.d.  It is of note that the patient      has history of coronary artery disease and beta-blockers indicated;      however, the patient has had deterioration of COPD, which could be      a result of beta-blocker use.  Beta-blocker can cause some      bronchoconstriction.  After reviewing history, the patient has been      on beta-blocker for greater than 5 years; however, documentation of      the use of thiazide or ACE was not shown.  The patient will      continue to follow with Cardiology and PCP.  The PCP may consider      decreasing beta-blocker with addition of thiazide or      antihypertensive if the patient continues to have  deterioration of      COPD.  4. CAD.  The patient has a history of coronary artery disease, status      post stent x2 and abdominal aortic aneurysm repair.  The patient      will continue on Lipitor 40 mg and aspirin daily.  5. Tobacco abuse.  The  patient receive nicotine patch during inpatient      admission and has no desire to quit smoking.  The patient was      instructed in the use of oxygen and needs for smoking cessation as      well as dangers of tobacco use around the oxygen tank.  6. GERD.  The patient has a history of GERD.  Protonix 40 mg IV was      given initially as the patient was n.p.o. for seizure precautions.      The patient will continue Protonix 40 mg p.o. b.i.d. as an      outpatient.   DISCHARGE MEDICATIONS:  1. Metoprolol 50 mg p.o. b.i.d.  2. Lipitor 40 mg daily aspirin.  3. Aspirin 325 mg p.o. daily.  4. Multivitamin one p.o. daily.  5. Viactiv 2 chewables daily.  6. Keppra 250 mg p.o. b.i.d., which will be titrated to 650 mg b.i.d.      with an increase of 250 mg weekly.  7. Albuterol nebulizers q.4 h. p.r.n. wheezing and difficulty      breathing.  8. Pulmicort Respules nebulizer treatment daily.  9. Home O2 at 2 liters to keep saturations to 93-95%.  10.Protonix 40 mg p.o. b.i.d.  11.Albuterol inhaler 2 puffs q.4 h. p.r.n. shortness of breath or      difficulty breathing, may use nebulizer or inhaler as needed.   DISCHARGE INSTRUCTIONS:  The patient is to return for evaluation if  there is evidence of chest pain, difficulty breathing, or uncontrolled  seizures.  The patient is to followup with the PCP for PFT, for COPD,  and for titration of Keppra to therapeutic dose.   ISSUES FOR FOLLOWUP:  Keppra titration to therapeutic dose.      Milinda Antis, MD  Electronically Signed      Leighton Roach McDiarmid, M.D.  Electronically Signed    KD/MEDQ  D:  10/21/2008  T:  10/22/2008  Job:  [id number]   cc:   Deanna Artis. Sharene Skeans, M.D.

## 2011-05-18 NOTE — Discharge Summary (Signed)
NAMEEMALEA, MIX NO.:  [id number]   MEDICAL RECORD NO.:  [id number]          PATIENT TYPE:  INP   LOCATION:  2621                         FACILITY:  [facility]   PHYSICIAN:  Pearlean Brownie, M.D.DATE OF BIRTH:  Jul 16, 1945   DATE OF ADMISSION:  08/31/2008  DATE OF DISCHARGE:  09/03/2008                               DISCHARGE SUMMARY   ADDENDUM   1. EKG on September 01, 2008, impression normal sinus rhythm      nonspecific T-wave abnormalities, questionable prolonged QT.   UPDATED DISCHARGE MEDICATION LIST:  1. Metoprolol 50 mg p.o. b.i.d.  2. Lipitor 40 mg p.o. daily.  3. Aspirin 325 mg p.o. daily.  4. Multivitamins 1 p.o. daily.  5. Viactiv 2 chewables daily.  6. Keppra 250 mg p.o. b.i.d., which will be titrated to 750 mg b.i.d.      with an increase of 250 weekly to achieve the 750 mg BID.  7. Albuterol nebulizers q.4 hours as needed for wheezing and      difficulty breathing.  8. Pulmicort Respules nebulizer treatment daily.  9. Home O2, 2 liters to keep the saturations between 92 to 95%.  10.Protonix 40 mg p.o. b.i.d.  11.Albuterol inhaler 2 puffs inhale q. 4 hours, may use nebulizer or      inhaler as needed.      Milinda Antis, MD  Electronically Signed      Pearlean Brownie, M.D.  Electronically Signed    KD/MEDQ  D:  09/12/2008  T:  09/13/2008  Job:  [id number]

## 2011-05-18 NOTE — Op Note (Signed)
NAMESAVITA, RUNNER NO.:  [id number]   MEDICAL RECORD NO.:  [id number]          PATIENT TYPE:  INP   LOCATION:  2302                         FACILITY:  [facility]   PHYSICIAN:  Lebron Conners, M.D.   DATE OF BIRTH:  06/26/1945   DATE OF PROCEDURE:  02/01/2006  DATE OF DISCHARGE:                                 OPERATIVE REPORT   PRE AND POSTOPERATIVE DIAGNOSIS:  Diverticulitis of the sigmoid colon.   OPERATIONS:  Sigmoid colectomy with colostomy and stapling of the distal  segment.   SURGEON:  Bowman.   ASSISTANT:  Hart Rochester.   ANESTHESIA:  General.   PROCEDURE:  Dr. Hart Rochester called me to the operating room because he had  explored the patient for suspicion of ruptured abdominal aortic aneurysm,  but found that there was only edema around the aneurysm. He had found an  abscess between the small bowel and colon and evidence of acute  diverticulitis which had been suspected by previous CT scan. He asked that I  scrub in to give attention to the bowel.  I checked the liver and found no  evidence of abscesses or inflammation of the gallbladder.  The sigmoid  mesentery was quite thickened and there were numerous adhesions to the  lateral pelvic wall and to pelvic viscera.  There was an obvious abscess  where perforation had occurred.  This had been drained by Dr. Hart Rochester.  Exposure was excellent. I freed up the lateral attachments of the sigmoid  colon very carefully taking care to avoid injury to the ureter and I also  incised the white line of Toldt along the descending colon.  I found that  there was redundancy of the sigmoid colon as it went down into the pelvis.  I cut adhesions and freed it up and got nice mobility. There was a quite  thickened extensive area of mesentery associated with the abscess. There was  evidence of ongoing inflammation. I incised the mesentery laterally and then  stapled across the mid sigmoid well above the area of great inflammation. I  then segmentally divided the mesentery between clamps staying well out  toward the bowel and thereby avoiding hazard to the ureter. I dissected on  down to the area of the sigmoid well beyond the area of obvious  diverticulitis and again stapled across the sigmoid colon with the cutting  stapler. I then assured good hemostasis. I sutured the end of the distal  segment to the lateral pelvic wall in hopes of perhaps in the future doing  an anastomose through a lateral incision without reopening the midline.  I  then assured that I had good mobility of the descending and sigmoid colon in  the end  a little bit in order to bring it out as colostomy. I chose a site  about even with the umbilicus about the lateral edge of the rectus muscle  and made about a 3 cm round skin incision and removed the cylinder of  subcutaneous tissues and then incised the rectus fascia and with the  cautery, I divided the rectus muscle. I then  entered the peritoneal cavity  and dilated that tract and brought the colon through and it stayed up  nicely. I again checked for good hemostasis in the left lower quadrant,  irrigated the area and removed the irrigant and then closure. Sponge, needle  and instrument counts were correct. I closed the midline fascia with running  #1 PDS and irrigated the subcutaneous tissues and closed the skin with  staples. I then matured the colostomy with interrupted and running 3-0  Vicryl suture and the colostomy appeared quite healthy with nice pink  mucosa. The patient was stable at the end of the operation.  I applied a  colostomy pouch and a bandage.      Lebron Conners, M.D.  Electronically Signed     WB/MEDQ  D:  02/01/2006  T:  02/02/2006  Job:  [id number]   cc:   Quita Skye. Hart Rochester, M.D.  [address]  [place]  [place] [address]

## 2011-05-18 NOTE — Consult Note (Signed)
NAMEEMA, HEBNER NO.:  [id number]   MEDICAL RECORD NO.:  [id number]          PATIENT TYPE:  INP   LOCATION:  2116                         FACILITY:  [facility]   PHYSICIAN:  Cherylynn Ridges, M.D.    DATE OF BIRTH:  Oct 20, 1945   DATE OF CONSULTATION:  01/22/2006  DATE OF DISCHARGE:                                   CONSULTATION   DATE OF CONSULTATION:  January 22, 2006   Thank you very much for asking me to see Ms. Braaksma, a 66 year old female who  is admitted on January 20, 2006 with dizziness, near syncopal episodes, and  weakness.  She was undergoing a workup at that time and thus far has been  found to have some renal insufficiency, possible rhabdomyolysis,  hypokalemia, hyponatremia, and increased glucose.  As part of her workup for  her altered mental status and possible delirium.  She was sent to CT scan  where she received oral and IV contrast.  It was after that the patient had  what seemed to be perhaps an allergic reaction to the contrast study needing  to a near arrest.  She was found to be cyanotic and breathing very laboredly  and was intubated and on the floor prior to be brought to the unit by the  rapid response team and the family practice service.   I was asked to see the patient because of the finding of hemobilia and some  pericholecystic fluid with a stone in the neck of the gallbladder found on  CT scan.  I reviewed these findings myself and looked at the scan, and  indeed she does have left lobar pneumobilia and a stone noted in the  gallbladder, although the amount of pericholecystic fluid and gallbladder  thickening is mild.   PAST MEDICAL HISTORY:  Her past medical history is very significant.  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  She has hypertriglyceridemia.  Hypercholesterolemia.  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  She has had previous aneurysm clipping.   MEDICATIONS:  1.  Aspirin.  2.  Nitroglycerin.  3.  Toprol-XL.  4.  Tricor.  5.  Albuterol.   ALLERGIES:  She is allergic to LIPITOR which gives her shakes and also  PENICILLIN which gives her hives.   She has had no previous abdominal surgery by report.   PHYSICAL EXAMINATION:  GENERAL:  I came by to examine the patient and she  was in the unit.  She is intubated and seems to be tolerating the ventilator  well and does not appear to be mottled or in septic shock.  ABDOMEN:  Her abdomen is distended with hyperactive and diffusely present  bowel sounds.  She has no palpable masses.  Although she is distended,  abdomen is soft.  No palpable masses.  Attempts to elicit pain from the  patient were unsuccessful and there was no grimacing upon deep palpation or  light palpation or tapping or percussion.  She does have diffusely present  bowel sounds.  RECTAL:  Being performed currently.  LUNGS:  Clear.  Her saturations were 100% on 100% FiO2 currently.  VITAL SIGNS:  Her BP was 82/43, pulse of 98.   LABORATORY STUDIES:  She has no recent white blood cell count.  Her  hemoglobin, however, most recently was 14.3 with a hematocrit of 42.  Her  LFTs are elevated with an AST of 150, ALT of 89, alk phos 207, total bili of  3.1.  Her lipase is 60.  She has an ammonia level of 38.  The patient does  have a history of alcohol consumption, however, her alcohol level on  admission was less than 5.  Her renal function has improved with a BUN of  27, creatinine of 1.4 currently.  Her blood gas shows a pH of 7.45, PACO2 of  45, PO2 of 184, and a bicarb of 31.  She is metabolically acidotic.   IMPRESSION:  With her abnormal liver function tests, her pericholecystic  fluid, the stone in her gallbladder, and pneumobilia of the left lobe of her  liver, we feel concern for possible enteric choledochal connection or  fistula.  The possibility of a gallstone or ileus is also present, however,  the pattern  of bowel with distention does not ream forward for that  currently.  She has no abdominal pain or tenderness that can be elicited,  however, the level of her mental status at this time is uncertain, although  she is not receiving any sedation as her intubation was done without any  medications at all.  She was currently not being sedated or being given pain  medicine.  The possibilities that can give her pneumobilia are perforated  ulcer is some type of enteric biliary fistula or severe dead bile with  mesenteric venous pneumatosis which does not appear to be consistent with  her clinical examination.   The patient is in the midst of volume resuscitation.  In spite of not being  acidotic, she does appear to require some volume, and with that and the  possibility of an anaphylactic reaction, I do not think that urgent  operative intervention is necessary, although it may be necessary in the  future.  I am concerned about the pneumobilia and the pericholecystic fluid  with the stone.  However, I am not sure if this is amenable to a  laparoscopic cholecystectomy.  Whether or not the patient will require an  open gallbladder with more meaningful exploration and a common duct  exploration based on her CT findings and this procedure at the current time  I do not think the patient would tolerate very well.  Only current studies  that are pending are reviewed chest x-rays and also coagulation profile  which may be abnormal secondary to her abnormal liver function.  We will  check these currently.  We will not rush her off to the OR, but I did  discuss this case with Dr. Jayme Cloud and we will be present for operative  intervention once the patient has been stabilized if that is deemed to be  the most appropriate action at that time.      Cherylynn Ridges, M.D.  Electronically Signed     JOW/MEDQ  D:  01/22/2006  T:  01/22/2006  Job:  [id number]  cc:   Leighton Roach McDiarmid, M.D.  Fax: [phone]    Danice Goltz, M.D. [facility]  [address] [place], [place] [address]

## 2011-05-18 NOTE — Op Note (Signed)
NAMEBENNETTA, [name] [name] NO.:  [id number]   MEDICAL RECORD NO.:  [id number]          PATIENT TYPE:  INP   LOCATION:  0153                         FACILITY:  [facility]   PHYSICIAN:  Lebron Conners, M.D.   DATE OF BIRTH:  1945-01-25   DATE OF PROCEDURE:  11/01/2006  DATE OF DISCHARGE:                                 OPERATIVE REPORT   PREOPERATIVE DIAGNOSIS:  Wound infection with disruption of the fascia and  evisceration.   POSTOPERATIVE DIAGNOSIS:  Wound infection with disruption of the fascia and  evisceration.   OPERATION:  Repair of wound dehiscence.   SURGEON:  Lebron Conners, M.D.   ANESTHESIA:  General.   PROCEDURE:  After the patient was monitored and asleep and had a Foley  catheter and then a nasogastric tube and had routine preparation and draping  of the abdomen, I removed the remaining staples.  There was some  evisceration of the small bowel at the upper end of the incision.  The  suture was intact but had separated from the edge of the fascia in the upper  part of the wound and there was an obvious wound infection present.  I  removed all of the suture which was present in the fascia.  I then cleared  the adhesions away from these edges of the fascia all way around and found  that there was no compromise of the bowel.  There were a couple of places  where the colon looked very bruised and I imbricated those with several  sutures of 3-0 silk but I thought it was intact and had good vascularity.  I  then explored the pelvic area and found no evidence of any abscess or pus or  any problem with the colon anastomosis.  I then irrigated the peritoneal  cavity copiously and removed the irrigant.  The sponge, needle and  instrument counts were correct.  I found no evidence of intra-abdominal  infection.  I closed the fascia and wound with a combination of 5 Ethibond  through-and-through retention sutures placed over Ethicon skin bridges and  running doubled  #1 PDS starting with a loop at the lower end and a simple  knot at the upper and tied with a simple knot in the middle.  I took  especially large bites of the fascia.  I left the wound open and packed the  interstices between the retention sutures with moist gauze covered with a  bulky bandage.  She tolerated the operation well.      Lebron Conners, M.D.  Electronically Signed     WB/MEDQ  D:  11/01/2006  T:  11/01/2006  Job:  [id number]

## 2011-05-18 NOTE — Op Note (Signed)
[name] [name], [name] [name]                ACCOUNT NO.:  [id number]   MEDICAL RECORD NO.:  [id number]          PATIENT TYPE:  INP   LOCATION:  2302                         FACILITY:  [facility]   PHYSICIAN:  Quita Skye. Hart Rochester, M.D.  DATE OF BIRTH:  04/12/1945   DATE OF PROCEDURE:  02/01/2006  DATE OF DISCHARGE:                                 OPERATIVE REPORT   PREOPERATIVE DIAGNOSIS:  Rule out ruptured abdominal aortic aneurysm -  expanding aneurysm with fluid surrounding vessel.   POSTOP DIAGNOSIS:  No evidence of rupture of aortic aneurysm -  diverticulitis with diverticular abscess with edema surrounding aneurysm.   SURGEON:  Dr. Hart Rochester.   FIRST ASSISTANT:  Dr. Tawanna Cooler Early.   INTRAOPERATIVE CONSULTATION:  Dr. Marcy Panning.   ANESTHESIA:  General endotracheal.   PROCEDURE:  The patient was taken urgently to the operating room, placed in  supine position. Satisfactory general endotracheal anesthesia was  administered. Swan-Ganz catheter, radial arterial lines were inserted by  anesthesia. The patient was stable hemodynamically and the abdomen was  distended. Midline incision was made after prepping and draped in routine  sterile manner. Incision was made from xiphoid to pubis, carried down  through subcutaneous tissue and linea alba.  The peroneal cavity was entered  and thoroughly explored. The stomach, duodenum and small bowel were  unremarkable. Liver was smooth and no palpable masses. The gallbladder had  some slight edema but there were no palpable stones. No evidence of any  inflammation. Transverse colon was elevated and intestines reflected to the  right side exposing the retroperitoneal space where there was a large  aneurysm measuring between 5 and 6 cm in diameter. There was no evidence of  any retroperitoneal hemorrhage. There was some slight edema surrounding the  aneurysm and retroperitoneum was not opened. While exploring further, it was  noted that the sigmoid colon was  thickened and adherent to the uterus in the  pelvis. While exploring in this area an abscess cavity was entered and  purulent material approximately 20 mL was evacuated and cultured and sent to  the laboratory. At this point Dr. Marcy Panning, who had been following the  patient from general surgery was consulted and he scrubbed. Further  examination of the sigmoid colon room revealed it to be quite thickened and  inflamed and it was felt that the patient had a diverticular abscess. It was  not felt safe to proceed with resection and grafting of the aneurysm even  though it had enlarged over the past week documented by CT scanning for fear  of infection of the aortic graft. Therefore, Dr. Orson Slick proceeded with  sigmoid colectomy with colostomy and [name] pouch and he will dictate that  portion of procedure.           ______________________________  Quita Skye. Hart Rochester, M.D.    JDL/MEDQ  D:  02/01/2006  T:  02/02/2006  Job:  [id number]

## 2011-05-18 NOTE — Op Note (Signed)
[name], [name] NO.:  [id number]   MEDICAL RECORD NO.:  [id number]          PATIENT TYPE:  INP   LOCATION:  0154                         FACILITY:  [facility]   PHYSICIAN:  Lebron Conners, M.D.   DATE OF BIRTH:  09-10-1945   DATE OF PROCEDURE:  10/25/2006  DATE OF DISCHARGE:                                 OPERATIVE REPORT   PREOPERATIVE DIAGNOSIS:  Functional colostomy, status post resection for  perforated diverticulitis.   POSTOPERATIVE DIAGNOSIS:  Functional colostomy, status post resection for  perforated diverticulitis.   OPERATION:  Colostomy closure.   SURGEON:  Lebron Conners, M.D.   ANESTHESIA:  General.   ASSISTANT:  Timothy E. Earlene Plater, M.D.   SPECIMEN:  Colostomy stoma, discarded.   ESTIMATED BLOOD LOSS:  200 mL.   COMPLICATIONS:  None.   PROCEDURE:  After the patient was monitored and asleep and had a Foley  catheter and had routine preparation and draping of the abdomen and after  having the colostomy stoma sewn shut with a 2-0 silk pursestring, I made an  elliptical incision around the stoma and dissected down through the fat to  the fascia and opened it laterally for a few cm each way and then freed the  stoma from the anterior peritoneum and the fascia.  There were a lot of  adhesions in the region.  I took down small bowel and omental adhesions to  give me reasonable mobility of the stoma.  I then searched for the distal  bowel.  I found two sutures of 2-0 Prolene on the lateral pelvic wall which  marked the site where I had anchored the stoma.  However, the distal bowel  had pulled away and was deeper down in the pelvis.  I could not safely free  it up enough to reach the proximal bowel and closed the colostomy through  the lateral incision.  I then made a midline incision and entered the  peritoneal cavity carefully and freed up adhesions around the distal bowel  and mobilized it as much as possible.  It appeared healthy and I  could  easily get to its anterior surface.  In the retroperitoneum, there was a  great deal of adhesion, a number of large veins, and I had sutured a couple  of venous bleeders. So I decided I would perform the anastomosis with the  distal bowel lying exactly as it did using anterior surface.  I then freed  up the proximal bowel more, dividing the lateral attachments.  I had a good  deal of attachment of the colon to omentum and small bowel medially and I  took that down.  I still did not have enough freedom, so I incised area of  the splenic flexure and very carefully brought that down.  Just a little  bleeding occurred near the spleen tip and I did not think the spleen,  itself, was bleeding but perhaps a small vein just posterior to it.  I  packed that with Surgicel and a large piece of gauze after I adequately  mobilized the splenic flexure.  I  took down adhesions of omentum and small  bowel to the descending colon.  I then resected the colostomy stoma with a  cutting stapler.  I had plenty of length of descending colon to lie side-by-  side with the distal bowel.  I secured that together with sutures proximal  and distal and performed a side-to-side anastomosis with the cutting  stapler.  I then closed the remaining defect with interrupted 2-0 silk  stitches after being sure that the proximal and distal lumens were nicely  opened.  The hemostasis was good.  I finished the line of through-and-  through silks and then tested each gap with a hemostat and found the  stitches were close enough.  I squeezed the bowel to make sure that it did  not leak any air.  I then sewed a second row of running 3-0 silk  incorporating some of the bowel wall and fat adjacent to it to make a better  seal.  I irrigated that area and removed the irrigant.  I checked the area  of bleeding around spleen and found it to be dry.  I then irrigated the  abdominal cavity to remove the little bit of blood that was  present and then  removed the irrigant.  The sponge, needle and instrument counts were  correct.  I closed both incisions with a running #1 PDS and then irrigated  the subcutaneous tissues and closed the skin with staples.  The patient was  stable throughout and went to the PACU in good condition.      Lebron Conners, M.D.  Electronically Signed     WB/MEDQ  D:  10/25/2006  T:  10/26/2006  Job:  [id number]

## 2011-05-18 NOTE — Cardiovascular Report (Signed)
[place]. [facility]  Patient:    [name] [name], [name] [name] Visit Number: [id number] MRN: [id number]          Service Type: EMS Location: ED Attending Physician:  Doug Sou Proc. Date: 08/19/01 Adm. Date:  08/17/2001 Disc. Date: 08/17/2001   CC:         Maryelizabeth Rowan, M.D.  Luis Abed, M.D. [facility]  Farley Ly, M.D.   Cardiac Catheterization  PROCEDURES PERFORMED: 1. Left heart catheterization. 2. Left ventriculogram. 3. Selective coronary angiography. 4. Percutaneous transluminal coronary angioplasty and stenting of the mid    right coronary artery. 5. AngioJet of the right coronary artery. 6. Direct-current cardioversion.  DIAGNOSES: 1. Severe single-vessel coronary artery disease. 2. Normal left ventricular systolic function. 3. Status post inferior wall myocardial infarction. 4. Ventricular fibrillation.  INDICATIONS:  The patient is a 66 year old, white female, with multiple cardiac risk factors but no prior cardiac history, who presents with arm discomfort, diaphoresis and shortness of breath.  The patient was found to have elevated cardiac enzymes and abnormal ECG consistent with recent and evolving inferior wall myocardial infarction.  The patient was stabilized medically and has had no recurrence of discomfort.  She presents now for further cardiac assessment.  TECHNIQUE:  After informed consent was obtained, the patient was brought to the cardiac catheterization lab.  A 6 French sheath was placed in the right femoral artery.  Left heart catheterization and selective angiography were then performed in the usual fashion using preformed 6 French Judkins catheter. The patient did have a brief run of ventricular fibrillation with injection of the right coronary artery that spontaneously resolved.  FINDINGS:  Initial findings are as follows: 1. Left main trunk:  Mild irregularities. 2. LAD:  This is a medium caliber vessel that  tapers significantly after the    first diagonal branch.  The LAD has mild diffuse disease of 30% in the    proximal and mid section.  The first diagonal branch has a segment of    aneurysmal dilatation in the mid section.  There is mild disease of 30-40%    in the proximal and mid section of the first diagonal branch. 3. Left circumflex artery:  This is a medium caliber vessel that provides    a bifurcating marginal branch in the mid section.  There is mild diffuse    disease in the left circumflex system. 4. Ramus intermedius:  This is a small caliber vessel with mild disease of    30% in the proximal segment. 5. Right coronary artery:  Dominant. This begins as a medium caliber vessel    that provides several RV marginal branches in the posterior descending    artery in its distal section as well as a small posterior ventricular    branch.  The right coronary artery has a moderate stenosis of 70% in the    mid section after the proximal bend.  There is then a further narrowing of    95% in the mid section near the takeoff of a second RV marginal branch.    This is immediately followed by a lucent density consistent with thrombus.    A third RV marginal branch is seen to provide the mid and distal inferior    wall.  The distal RCA has moderate disease of 30-40% with the distal    posterior descending artery being a small vessel that provides the proximal    inferior wall.  The posterior ventricular branch is a trivial  vessel with    moderate disease.  LEFT VENTRICULOGRAM:  Normal end-systolic and end-diastolic dimensions. Overall left ventricular function is well preserved, ejection fraction of greater than 55%.  No mitral regurgitation.  There is mild hypokinesis of the distal inferior wall.  LV pressure 118/0, aortic is 118/60, LVEDP equals 16.  These findings were reviewed with the patient and we elected to proceed with percutaneous intervention of the right coronary artery.  The 6  French sheath was exchanged for a 7 [place] sheath and a 5 French venous sheath was placed in the right groin.  The patient was given heparin and Integrilin on a weight-adjusted basis to maintain ACT of approximately 225 seconds.  She was also given 345 mg of aminophylline IV.  Plavix was administered orally 300 mg. A 7 [place] guide catheter was then used to engage the right coronary artery and a 0.014 inch Patriot wire advanced in the distal RCA.  AngioJet was then performed to the right coronary artery.  A forward pass at 13 seconds was performed and a reverse pass at 10 seconds.  Repeat angiography showed an excellent result with complete resolution of thrombus burden in the mid section of the right coronary artery.  There was no distal compromise of the vessel.  A 3.0 x 14 mm Radius stent was then carefully positioned in the mid RCA and deployed.  Due to the occlusive nature of the stent, the patient did develop ventricular fibrillation which responded to DC cardioversion.  A 2.75 x 12 mm Quantum Ranger balloon was introduced and used to post-dilate the Radius stent.  Two inflations were performed at 14 atmospheres for 30 seconds. Repeat angiography showed moderate residual narrowing of 30% at the area of severe stenosis.  A 3.0 x 12 mm NIR Elite stent was then introduced and primarily deployed in the proximal lesion at 14 atmospheres for 30 seconds. There was mild residual waste in the mid section of the stent of approximately 20%.  A 3.5 x 12 mm Quantum Ranger balloon was then introduced and used to postdilate the proximal segment of the Radius stent at 12 atmospheres for 30 seconds, as well as the NIR Elite stent placed in the proximal lesion at 14 atmospheres for 30 seconds.  Repeat angiography after the administration of  intracoronary nitroglycerin showed an excellent result with less than 10% residual narrowing at each stenosis with TIMI-3 flow through the right coronary  artery and distal vasculature.  There was no evidence of vessel damage or compromise of flow.  Final angiography was performed in various projections confirming TIMI grade 3 flow through the right coronary artery. The guide catheter was then removed and the sheath secured in position.  The patient tolerated the procedure well and was transferred to the ward in stable condition.  The sheaths will be removed when the ACT returns to normal.  FINAL RESULT:  Successful PTCA and stenting of the mid right coronary artery with reduction of sequential 70 and 95% narrowings with placement of a 3.0 x 12 mm NIR Elite stent, followed by a 3.0 x 14 mm Radius stent with less than 10% residual narrowing and TIMI grade 3 flow. Attending Physician:  Doug Sou DD:  08/19/01 TD:  08/19/01 Job: [id number]

## 2011-05-18 NOTE — Consult Note (Signed)
[name] [name], [name] [name]                ACCOUNT NO.:  [id number]   MEDICAL RECORD NO.:  [id number]          PATIENT TYPE:  INP   LOCATION:  2302                         FACILITY:  [facility]   PHYSICIAN:  Clois Comber. Margo Aye, MD       DATE OF BIRTH:  December 04, 1945   DATE OF CONSULTATION:  02/07/2006  DATE OF DISCHARGE:                                   CONSULTATION   REASON FOR CONSULTATION:  To assist with goals of care.   HISTORY OF PRESENT ILLNESS:  Ms. Wurtz is an unfortunate 66 year old  Caucasian female who was admitted on January 20, 2006, with dizziness,  syncope, and weakness. After admission she was found to have some renal  insufficiency, hypokalemia, hypernatremia, and was found to be septic. Since  that time she developed pulmonary failure and was mechanically ventilated  for seven days. They treated her for pneumonia and cholecystitis, and after  CT of the abdomen she was found to have an abscess, after which she  underwent removal of the abscess and now has a colostomy. After surgery she  improved and was in the [facility]. A repeat CT confirmed that her abscess was  improving, but found an enlarging aortic aneurysm that had grown from 4 cm  to 5.7 cm, with fluid and edema surrounding it that was not present on  previous scans. She underwent repair and stenting of her aneurysm and since  that time she has had a slow wean from the ventilator.   The patient's family states that she never wanted to be on a ventilator for  prolonged periods of time. She has continued to have fevers as well as some  renal failure. Her condition is very fragile and Dr. Gavin Potters asked me to  assist the patient and family with goals of care in anticipation that she  may continue to decline and also to support her wishes of no prolonged  ventilation or artificial hydration or nutrition.   Of note, I met this family while performing a consult on the patient's  boyfriend who has a subdural hematoma and will likely be  withdrawn  terminally from the vent tomorrow a.m.  This is important as it is felt by  the family that she would not want to live if she knew he was dying.   IMPRESSION:  1.  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  2.  Abdominal aortic aneurysm repair.  3.  Ventilator dependent respiratory failure, weaning slowly.  4.  Fever, infectious disease following. Questionable methicillin-resistant      Staphylococcus aureus. Pneumonia resolving.  5.  Underlying:      1.  Cerebrovascular accident with expressive aphasia, hypertension,          coronary artery disease, increased lipids, aneurysm clipping,          chronic obstructive pulmonary disease, and chronic bronchitis.      2.  Functional decline prior to admission over the last year with          increasing shortness of breath with exertion. Also more dependent on  the patient's daughter for finances and some IADLs.      3.  Emotional component: The patient's boyfriend has been very sick as          well and she takes care of him at home.      4.  The patient's wishes were voiced to family, no prolonged life          support.   RECOMMENDATIONS:  This is a very complex case. The patient has some areas  and process that could improve. She may continue to tolerate weaning. The  family understands how fragile the patient is. They also understand her  wishes for no prolonged support.  1.  Her goals of care could change if she knew her boyfriend is dying. Her      family feels that she would want to know. I will be meeting with the      patient's son and daughter tomorrow to continue discussion about      notifying the patient about her boyfriend as well as to assist them with      goals of care. Baseline mental status has not returned since abdominal      aortic aneurysm repair, but is thought to be related to sedation.  2.  Her goals of care will be dependent on her progression over the next few      days. She  is currently tolerating weaning per staff. If she weans the      family would not want reintubation based on the patient's wishes if she      was to decline.  3.  Continue support emotionally and spiritually from palliative care as      there are several psychosocial dynamics.   DISCUSSION:  I am not sure of prognosis at this point. She has a high risk  for continued functional decline, continuing malnutrition, infection, sepsis  with full aggressive therapies. There is also an emotional loss and grief  component that could complicate her progression. Thank you for this very  appropriate referral.   PAST MEDICAL HISTORY:  1.  Coronary artery disease.  2.  Hypertriglyceridemia.  3.  Hypercholesterolemia.  4.  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  5.  Previous surgery including cerebral aneurysm clipping.  6.  History of COPD.  7.  Diverticulitis.  8.  Sigmoid colectomy.  9.  Abdominal aortic aneurysm repair.  10. Pneumonia.   SOCIAL HISTORY:  The patient is divorced, but has a boyfriend, and lives at  home with him in [place]. She is disabled, but is able to get around the  house. Her family reports that she has been more withdrawn and has not been  able to perform her IADLs as she used to. She smokes two packs of cigarettes  a day and has moderate alcohol consumption.   FAMILY HISTORY:  The patient's mother died in her 30s due to neoplastic  disease. Her father died at age 38 due to renal failure and they both had a  history of MI.   ALLERGIES:  LIPITOR and PENICILLIN.   REVIEW OF SYSTEMS:  The patient is currently intubated and cannot answer  yes/no questions, however she is able to squeeze her hand to answer  questions at times. She denies pain. She states that she is sore and does  feel short of breath. She answers yes when asked if she would like  to be off the ventilator. She denies headache. She  denies nausea. Staff reports she  grimaces when repositioned.   PHYSICAL EXAMINATION:  VITAL SIGNS: BP 128/70, heart rate 99, respirations  14 on the ventilator, afebrile.  GENERAL: She is a chronically ill appearing female who is in no apparent  distress. She is able to answer yes/no questions by squeezing of the hand.  NECK: Supple. No lymphadenopathy, no thyromegaly.  LUNGS: Diminished bilaterally.  HEART: Heart rate regular. No murmurs, rubs, or gallops.  ABDOMEN: Obese, no pulsatile mass palpable. Abdomen distended. Colostomy in  place. No tenderness to palpation.  EXTREMITIES: Trace edema.   LABORATORY DATA:  White blood cell count 8.3, hemoglobin 11.2, hematocrit  33. PT 17, INR 1.4. Sodium 134, potassium 4.4, glucose 158, BUN 9,  creatinine 0.7, SGOT 17, alkaline phosphatase 132.      Asencion Noble, NP      Clois Comber. Margo Aye, MD  Electronically Signed    KMJ/MEDQ  D:  02/08/2006  T:  02/09/2006  Job:  [id number]   cc:   [facility], M.D.  Fax: [phone]

## 2011-05-18 NOTE — Discharge Summary (Signed)
NAMECAROLYNNE, SCHUCHARD                ACCOUNT NO.:  [id number]   MEDICAL RECORD NO.:  [id number]          PATIENT TYPE:  INP   LOCATION:  5730                         FACILITY:  [facility]   PHYSICIAN:  Pearlean Brownie, M.D.DATE OF BIRTH:  February 04, 1945   DATE OF ADMISSION:  01/20/2006  DATE OF DISCHARGE:  01/21/2006                                 DISCHARGE SUMMARY   ADDENDUM:  Ms. Sherlin was discharged on January 21, 2006 after getting 2  units of packed red blood cells on January 20, 2006.  Her hemoglobin came up  from 8-something to 10.9.  Additional discharge medications include  multivitamin and FeSO4 325 mg p.o. daily.      Rolm Gala, M.D.    ______________________________  Pearlean Brownie, M.D.    Bennetta Laos  D:  02/21/2006  T:  02/21/2006  Job:  [id number]

## 2011-05-18 NOTE — Assessment & Plan Note (Signed)
[facility]                              CARDIOLOGY OFFICE NOTE   NAME:Consoli, WEDA BAUMGARNER                       MRN:          [id number]  DATE:08/13/2006                            DOB:          1945-04-24    OFFICE NOTE AND CARDIAC CLEARANCE NOTE.   Pattye Meda is seen for cardiac evaluation.  The patient has coronary  disease.  She had an MRI in September of 2002.  She received stenting  through her right coronary artery and from the cardiac viewpoint she has  been stable.  Her ejection fraction was 55% with some mild distal inferior  hypokinesis.  She had single vessel disease.  In January of 2007, the  patient was admitted to [facility].  It was thought that her initial  symptoms might have been biliary.  However, this stabilized and the patient  began to try to repair her abdominal aortic aneurysm but he found an abscess  with a perforation of the sigmoid colon.  Dr. Orson Slick did a sigmoid colectomy  and colostomy and she stabilized.  Dr. Hart Rochester did proceed with a bi-iliac  __________ stent.  She stabilized with this.  She was quite ill in general,  but ultimately stabilized and was discharged home.  She now needs for her  colostomy to be taken down by Dr. Orson Slick.  The patient did have a 2D echo at  the hospital on January 23, 2006, and her ejection fraction was 70%.  There  were no major valvular abnormalities.   PAST MEDICAL HISTORY:  ALLERGIES:  PENICILLIN.   MEDICATION:  1. Prilosec OTC.  2. Lexapro.  3. Iron.  4. Toprol-XL 100.  5. Centrum.   The patient is advised to start aspirin today and continue aspirin unless it  is being held for her abdominal surgery.   OTHER MEDICAL PROBLEMS:  See the list below.   REVIEW OF SYSTEMS:  The patient unfortunately continues to smoke.  She has  been unable to stop.  However, she is feeling well and her review of systems  is negative.   PHYSICAL EXAM:  Blood pressure is 124/70 with a pulse of  57.  The patient is  oriented to person, time and place.  She has somewhat of an expressive  aphasia and this affects the appearance of her affect.  LUNGS:  Reveal a few scattered rhonchi.  HEENT:  Reveals no xanthelasma.  She has normal extraocular motion.  There  are no carotid bruits.  There is no jugular venous distention.  CARDIAC EXAM:  Reveals an S1 with an S2.  There are no clicks or significant  murmurs.  ABDOMEN:  Reveals her colostomy.  She has no significant peripheral edema at  this time.   EKG reveals no marked abnormalities.   PROBLEMS:  1. History of a myocardial infarction in 2002 with intervention of the      right coronary artery, stable.  2. Good left ventricular function re-documented in January of 2007.  3. History of ongoing smoking as described above.  4. History of a subarachnoid hemorrhage treated in  the past with left      middle cerebral artery clipping in the past.  5. Hyperlipidemia.  It would be appropriate to use a Statin if she is      willing to take it.  6. History of diverticulosis of the sigmoid colon with perforation and a      colectomy and now she needs colostomy repair.  7. Abdominal aortic aneurysm that was expanding and repaired on February      5th with a stent graft.  8. Diabetes, question, during the hospitalization.  9. Acute renal failure during her hospitalization which then improved.  10.History of a cerebrovascular accident.  11.Residual expressive aphasia.   The patient's overall cardiac status is stable.  I have encouraged her to  restart aspirin.  Also, she would be encouraged to be on a Statin.  She had  been on Lipitor.  We will check as to why it was stopped.  Also, it is very  important that her beta-blocker be continued around the time of her surgery.                               Luis Abed, MD, [facility]    JDK/MedQ  DD:  08/13/2006  DT:  08/13/2006  Job #:  [id number]   cc:   Lebron Conners, MD

## 2011-05-18 NOTE — Discharge Summary (Signed)
[name] [name], [name] [name]                ACCOUNT NO.:  [id number]   MEDICAL RECORD NO.:  [id number]          PATIENT TYPE:  INP   LOCATION:  1621                         FACILITY:  [facility]   PHYSICIAN:  Ardeth Sportsman, MD     DATE OF BIRTH:  [birth year]   DATE OF ADMISSION:  10/25/2006  DATE OF DISCHARGE:  11/12/2006                                 DISCHARGE SUMMARY   SURGEON:  Lebron Conners, M.D.   PRIMARY CARE PHYSICIAN:  Bonnee Quin, M.D., [facility].   GASTROENTEROLOGIST:  Iva Boop, M.D., [facility].   DIAGNOSES:  1. Ruptured diverticulitis with abscess, status post abdominal exploration      with sigmoid colectomy and Hartmann procedure on February 01, 2006.  2. Colostomy, requesting takedown.  3. Malnutrition, with fascial dehiscence.  4. Hypertension.  5. Infrarenal abdominal aortic aneurysm, status post repair on February 04, 2006.  6. Heartburn.  7. Chronic obstructive pulmonary disease.  8. Chronic bronchitis.  9. Tobacco abuse.  10.Deconditioning.  11.Depression.   PROCEDURES PERFORMED:  1. Lysis of adhesions and takedown of colostomy on October 25, 2006 by Dr.      Orson Slick.  2. Emergent reexploration and repair of fascial dehiscence on November 01, 2006.   SUMMARY OF HOSPITAL COURSE:  [name] [name] is a 66 year old woman with numerous  medical issues who had a protracted course earlier in the year for abdominal  aortic aneurysm and ruptured diverticulitis.  She eventually stabilized to  the point that it was felt safe to have her colostomy taken down.  She  underwent this on October 26, 2006.  She was followed.  She developed acute  respiratory failure after colostomy, and pulmonary critical care was  consulted.  She was placed on empiric antibiotics and followed closely.  She  initially seemed to be open up, but then developed postoperative ileus and  was eventually started on parenteral nutrition.  Unfortunately, her incision  started  to open up and had some drainage, and on exploration it was found to  have dehisced.  She was taken to the operating room on postoperative day 7  and was re-closed with retention sutures.  She was followed closely in the  intensive care unit for close monitoring and pulmonary status.  She was  ventilator dependent for a few days but ultimately extubated.  She was  continued on parenteral nutrition and placed on broad antibiotics and  followed closely.  Eventually, she was extubated in a couple days and was  able to be transferred to the floor.   At the end of hospitalization, she was walking the hallways with the  assistance of physical therapy and a walker.  She was advanced on her diet  to the point that she was able to take about 2-3 supplemental shakes as well  as some oral intake and felt safe to be off parenteral nutrition.  She had  no evidence of infection and showed no evidence of any leukocytosis or  febrile event off antibiotics.  Her blood  pressure remained stable.  She was  strongly encouraged to avoid smoking.  Apparently, she wanted to try and  smoke, but I talked to her daughter at length as well about this, and the  patient will make a more sincere effort with this.  Given these, I thought  it was reasonable for her to be discharged with the following instructions.   1. She will be transferred to [facility] skilled nursing facility for      rehab and wound care as a transition, ultimately, to be able to go      home.  2. She should work with physical therapy to increase physical activity and      keep wearing her binder, with retention sutures in place.  Her wound      was granulating well, and will follow up with wound checks, but at this      point allow it to close in by secondary intention.  3. She should follow up and see Dr. Orson Slick in 1-2 weeks for wound      management and make sure that she is improving in an outpatient      setting.   She should be discharged  with her home medications, which include:  1. Metoprolol 100 mg daily.  2. Centrum vitamins.  3. Iron.  4. Lexapro 10 mg daily.  5. Prilosec 20 mg p.o. daily p.r.n.  6. Enteric coated aspirin.  She really can start that as of November 30, 2006, but hold off at this point.      Ardeth Sportsman, MD  Electronically Signed     SCG/MEDQ  D:  11/12/2006  T:  11/12/2006  Job:  [id number]

## 2011-05-18 NOTE — Discharge Summary (Signed)
[name] [name], [name] [name]                ACCOUNT NO.:  [id number]   MEDICAL RECORD NO.:  [id number]          PATIENT TYPE:  INP   LOCATION:  5730                         FACILITY:  [facility]   PHYSICIAN:  Rolm Gala, M.D.    DATE OF BIRTH:  12-23-1945   DATE OF ADMISSION:  01/20/2006  DATE OF DISCHARGE:                                 DISCHARGE SUMMARY   ATTENDING PHYSICIAN:  Pearlean Brownie, M.D.   PRIMARY CARE PHYSICIAN:  Anastasio Auerbach, MD   CONSULTATIONS:  1.  Critical care management, Dr. Delton Coombes.  2.  [facility], Dr. Orson Slick.  3.  CVTS, Dr. Hart Rochester.  4.  Cardiology, Dr. Myrtis Ser.  5.  GI, Dr. Juanda Chance.  6.  Palliative care, Dr. Berkley Harvey.   PROCEDURE:  1.  CT of the head on January 21 showed old MCA infarct.  2.  On January 22, CT showed pneumobilia, abdominal aortic aneurysm, 4 cm.  3.  On January 24, ERCP was normal, and a biliary stent was placed.  4.  On January 27, a CT showed heterogenous area of the liver, 4.5 cm      abdominal aortic aneurysm, sigmoid diverticulitis with free fluid.  5.  An EGD on January 30 showed gastritis and a gastric ulcer without      bleeding.  6.  A CT on February 2, shows an abdominal aortic aneurysm of 5.4 cm.  7.  On February 3, a HIDA scan was normal.  8.  On February 2, a colectomy and colostomy was done by [facility]      [facility].  9.  A CT on February 4 showed an expanding aortic aneurysm to 6 cm.  10. The patient's abdominal aortic aneurysm was grafted by Dr. Hart Rochester with      CVTS.  11. Swallow study on February 13 was done with fluoroscopy showing likely      aspiration.   PROBLEM LIST:  1.  Diverticulitis of the sigmoid colon with abscess and perforation, status      post sigmoid colectomy and colostomy with stapling of the distal segment      on February 2.  2.  Abdominal aortic aneurysm expanding below the infrarenal's.  Repaired on      February 5 with a common iliac artery with a biocommon iliac Cook Zenith    stent graft via the bilateral common femoral artery cutdown.  3.  Vent-dependent respiratory failure, resolved.  4.  Poor nutrition.  5.  Dysphagia.  6.  Elevated LFTs.  7.  Anemia.  8.  Diabetes.  9.  Sacral ulcer.  10. Hypokalemia.  11. Acute renal failure secondary to dehydration and rhabdomyolysis.  12. Rhabdomyolysis.  13. Hyponatremia.  14. Volume overload.  15. Gastric ulcer without bleeding.  16. Gastritis.  17. Hypercholesterolemia.  18. Hypertriglyceridemia.  19. History of MI.  20. History of TIA.  21. History of CVA.  22. COPD.  23. History of coronary artery disease.  24. Tobacco abuse.  25. Alcohol abuse.  26. Aphasia.  27. History of brain aneurysm.   SIGNIFICANT LABORATORY DATA:  On  January 20, the patient's hemoglobin was  8.9.  On January 20, her discharge sodium was 140, potassium 4.7, chloride  112, bicarb 21, glucose 19, BUN 33, creatinine 0.8, bili 0.9, alk phos 532,  AST 55, ALT 53, total protein 6.7, albumin 1.9 and calcium 9.1.  The last  magnesium prior to discharge was 2.4.  The patient's triglycerides on  February 19 was 137.  The patient's prealbumin on February 19 was 16.6.  The  patient total cholesterol on February 19 was 125.  The patient was fecal  occult negative on February 18.  Antimitochondrial antibody on February 13  was negative.  Respiratory culture from February 11 showed no organisms  seen.  A GGT on February 13 was 602.  Alk phos on February 13 was 504.  LFTs  were within normal limits.  A blood culture from February 6 showed no  growth.  Blood cultures from February 4 also showed no growth.  INR on  February 9 was 1.3, down from 1.9 on February 6.  An anaerobic culture  collected from the abdominal abscess showed no organisms.  Urine culture on  February 6 showed no growth.  A DIC panel on February 6 shows platelets 351,  PT 18.7, INR 1.5, PTT 42.  C. diff on January 28 was negative.  The patient  was fecal occult blood  positive on January 29.  A culture from the biliary  ERCP grew out Candida tropicalis and Candida glabrata.  This was from  January 24, 2006.  An amylase on January 26 was 66.  Lipase on January 26  was 33.  Urine drug screen was negative on January 21.  Cardiac enzymes were  negative x3.  An ionized calcium on January 23 was low at 0.98.  The patient  was fecal occult blood positive on January 23.  Antinuclear antibody on  January 21 was negative.  CMV on January 21 was also negative.  EBV on  January 21 was negative.  A blood ammonia level on January 22 was 38.  Fractionated bilirubin on January 22 showed a total bilirubin of 3.4, direct  2.3, indirect 1.1.  A TSH on January 21 was 1.558.  An acute hepatitis panel  on January 21 was negative.  HB A1C on January 21 was 5.6.   HOSPITAL COURSE:  This is a 66 year old female who came in and during her  hospital stay had diverticulitis of the sigmoid colon with abscess and  perforation and an abdominal aortic aneurysm that was stented.  Problem 1:  Diverticulitis of the sigmoid colon with abscess with  perforation.  Patient came in with some diffuse abdominal pain on January  21.  She was watched carefully, and it was initially thought that this  abdominal pain may be related to her biliary system; however, an ERCP and  EGD were both negative.  A CT also showed some edema of the gallbladder, but  this was likely due to diffuse volume overload.  On February 2, Dr. Hart Rochester  went in to repair her abdominal aortic aneurysm but found an abscess with a  perforation of the sigmoid colon.  Dr. Orson Slick came in to this surgery and  repaired this, doing a sigmoid colectomy and colostomy with stapling of the  distal segment.  The patient was continued on aztreonam, Flagyl, and  vancomycin for 14 days postop.  After this antibiotic regimen, she was  afebrile.  The patient was given colostomy training during her hospital  stay. Problem  2:  Expanding  infrarenal abdominal aortic aneurysm:  This aortic  aneurysm was initially noted on CT on January 22, and it was 4 cm.  A CT on  January 31 showed it expanded to 4.3 cm.  A follow-up CT on February 2  showed it expanded to 5.4 cm, and CVTS was called.  Dr. Hart Rochester went in to  repair this aortic aneurysm but found that the sigmoid abscess of the  sigmoid colon had perforated, and he was unable to finish the surgery.  A  repeat CT on February 4 showed a 6 cm abdominal aortic aneurysm, and this  was repaired on February 5 with the insertion of a biocommon iliac Cook  Zenith stent graft via bilateral common femoral artery cut-downs.  Prior to  the repair, patient was kept on metoprolol for blood pressure control.  This  was continued after her surgery.  The patient had no problems after her  graft; however, there is a concern, given the diffuse infection that the  stent may have become populated with bacteria.  This will be closely  followed by her primary doctor.  Problem 3:  Vent-dependent respiratory failure:  On January 23, the patient  went into respiratory arrest, likely secondary to sepsis.  She was intubated  and then put into the ICU.  She was extubated on January 27; however, she  needed to be reintubated on January 29 again, due to sepsis and inability to  protect her airway.  She was re-extubated on January 31.  Problem 4:  Poor nutrition/dysphagia:  The patient was n.p.o. for a  prolonged period of time and unable to initially use her gut after the  surgery.  She was given TPN for 10 days.  She was encouraged to start using  her gut three days before the TPN was taken off.  She began eating  minimally.  A calorie count was done, and it found that she was not taking  in sufficient calories; however, she began to experience cholestasis, and  her LFTs began to increase.  We discussed using enteral feeds with the  patient, but she refuses.  The TNA was stopped on February 17, 2006, and  the  patient continued to take in p.o. foods.  She was given Megace to help her  appetite.  The patient had a swallow study with fluoroscopy on February 13,  and it was found that she needs dysphagia III diet, honey-thickened liquids,  and this will be continued as an outpatient.  Problem 5:  Elevated LFTs:  Initially, we thought this was due to  pneumobilia or a biliary gallbladder disease; however, it was later thought  this was likely due to a pending infection that had spread from her  diverticular abscess.  A CT on January 22 showed pneumobilia, but a second  radiologist likely thought this was more air in the portal venous system.  Her LFTs came down after her diverticular surgery; however, they increased  again with the TNA.  After the TNA was stopped, they again went back towards  normal.  On January 24, the patient received a Biostent that grew out  Candida.  She was started on caspofungin, and this was continued for 14 days  postoperatively. Problem 6:  Volume overload:  The patient was volume overloaded, likely due  to sepsis.  She was positive multiple liters daily until we started her on  Lasix.  We followed her pulmonary artery wedge pressures, and her chest x-  rays  and Lasix were eventually stopped, as the patient was better able to  diurese.  Problem 7:  Patient came in hemoconcentrated with a hemoglobin of 13.  This  came down to 10; however, during her septic episodes, it went down as far as  8, and she was given 2 units of packed red blood cells, which brought her  back up to 10.  Prior to discharge, she was 8.9.  This will closely need to  be followed by her outpatient physician.  Problem 8:  Hyperglycemia:  The patient has no history of diabetes, and her  HB A1C was within normal limits; however, during her hospital stay secondary  to the TNA and sepsis, she did have some hyperglycemia.  She was started on  sliding-scale insulin and given 10 units of Lantus every  a.m.; however,  prior to admission, her blood sugars came down, and this was stopped.  Problem 9:  Sacral ulcer:  The patient has a grade I ulcer on her sacrum  secondary to bedridden and incontinent prior to her surgery.  She was on an  air foam bed and turned frequently.  She was also given barrier cream for  this problem.  Problem 10:  Hypokalemia:  The patient had problems with hypokalemia  throughout most of her hospital stay.  This was likely secondary to Lasix.  She was repleted with K-Dur and KCL as needed.  She was stable without any  of these medications for five days prior to discharge.  Problem 11: DVT prophylaxis:  Patient was initially started on SCDs prior to  the surgery; however, after the surgery, she was switched to Lovenox.  Problem 12:  Acute renal failure:  The patient came in with an elevated  creatinine, likely secondary to dehydration.  This resolved with IV fluids.  Problem 13:  Hyponatremia:  This is also likely secondary to dehydration  with results of IV fluids.  Problem 14:  Gastric ulcer without bleeding:  The patient was started on  Protonix p.o. b.i.d. and will continue this as an outpatient.  Problem 15:  Hypercholesterolemia and hypertriglyceridemia:  Due to  patient's poor nutrition and poor diet recently, the patient has not needed  her home dose of Welchol and statins.  These were held as an outpatient and  will need to be followed up on.  Problem 16:  Tobacco abuse:  Patient was not able to smoke while in our  facility.  She was given a 21 mg nicotine patch daily, and this will be  continued as an outpatient.  Problem 17:  History of alcohol abuse:  Patient was started on an Ativan  taper.  She was given thiamine and folate.  She showed no signs of  withdrawal.  Problem 18:  Hypertension:  Patient was newly diagnosed with hypertension as  an outpatient.  During her hospital stay here, she had elevated blood  pressures and started initially on 50 mg  b.i.d. of metoprolol.  This was slowly increased to 120 mg b.i.d. of Toprol XL.   Patient is a DNR/DNI.   MEDICATIONS:  1.  Nicotine patch 21 mg every 24 hours unless patient smokes.  If she does      so, please remove patch.  2.  Protonix 40 mg p.o. b.i.d. with meals.  3.  Trazodone 100 mg p.o. at bedtime p.r.n. insomnia.  4.  Megace 625 mg at 40 mg/ml suspension p.o. daily.  5.  Barrier cream.  6.  Atrovent 0.5 mg  nebulizer inhaled 4 times a day.  7.  Albuterol 2.5 mg nebs every 4 hours p.r.n.  8.  Toprol XL 125 mg p.o. b.i.d.  9.  Tylenol 650 mg p.o. every 4 hours p.r.n. pain/fever.  10. Ensure pudding p.o. t.i.d. with meals.  11. Nitroglycerin 0.4 mg sublingual every 5, up to three times p.r.n. chest      pain.  12. Thick-It instant food thickener.  13. Milk of Magnesia 50 mg p.o. daily p.r.n. constipation.  14. Oxygen to keep sats greater than 92%.  15. Aspirin 325 mg p.o. daily.      Rolm Gala, M.D.     Bennetta Laos  D:  02/19/2006  T:  02/19/2006  Job:  [id number]   cc:   Anastasio Auerbach, MD  Fax: [phone]   Leslye Peer, M.D.   Lebron Conners, M.D.  [address], [address]  [place]  [place] [address]   Quita Skye. Hart Rochester, M.D.  [address]  [place]  [place] [address]   Lina Sar, M.D. [facility]  [address]  [place]  [place] [address]   Willa Rough, M.D.  [address]  [place]  [place] [address]   Dr. Berkley Harvey  Palliative Care

## 2011-05-18 NOTE — H&P (Signed)
NAMENASHALI, DITMER NO.:  [id number]   MEDICAL RECORD NO.:  [id number]           PATIENT TYPE:   LOCATION:                                 FACILITY:   PHYSICIAN:  Rolm Gala, M.D.         DATE OF BIRTH:   DATE OF ADMISSION:  DATE OF DISCHARGE:                                HISTORY & PHYSICAL   CHIEF COMPLAINT:  The patient is weak, states she is dizzy.   HISTORY OF PRESENT ILLNESS:  The patient was in her usual state of health  until mid last week when she started complaining of aching, chills, and  decreased appetite. The patient had subjective fevers.   The patient's daughter describes problems with her legs giving out  __________. The daughter says this morning she was increasingly weak and  visibly shaky but she got better as the day went on, especially after having  hydration. The daughter reports the patient was disoriented. She thinks she  was smoking without a cigarette. The patient's daughter __________. The  daughter reports the patient has been drinking lots and lots of water which  is unusual for her. No history of trauma.   REVIEW OF SYSTEMS:  Negative for headache. Positive for dizziness. No chest  pain. Positive palpitations but they have now gone. Pulmonary:  Shortness of  breath but now better. The patient has some baseline shortness of breath. No  nausea, diarrhea, constipation, emesis. No burning with peeing. No increased  urinary frequency. __________.   PROBLEM LIST:  1.  Hypercholesterolemia, hypertriglyceridemia.  2.  MI.  3.  TIA.  4.  CVA.  5.  COPD.  6.  Syncope. There are no __________.  7.  Coronary artery disease.   MEDICATIONS:  1.  Aspirin 325 mg p.o. daily.  2.  Lipitor 80 mg p.o. q.h.s. The patient stopped this about a week ago.  3.  Nitroglycerin 0.4 sublingual p.r.n.  4.  Toprol-XL 25 mg p.o. daily.  5.  TriCor 134 mg p.o. daily.  6.  Albuterol MDI.   ALLERGIES:  1.  PENICILLIN - the patient gets hives.  2.  LIPITOR - the patient feels she gets shaky and __________.   FAMILY HISTORY:  Significant for mother who died of cancer in 02-16-2023 and  father whose kidneys failed __________  who also had an MI __________.   SOCIAL HISTORY:  The patient is divorced and lives by herself but has  boyfriend who visits frequently. The patient smokes about two packs per  days. Drinks three very large glasses of wine a night. No drugs. The  daughter is Luciana Axe, phone number [phone].   PHYSICAL EXAMINATION:  VITAL SIGNS:  Temperature 97.5, pulse 95,  respirations 22, blood pressures range from 82 to 92 over 47 to 67, 93% in  room air. CBG is 163.  GENERAL APPEARANCE:  The patient was drowsy but alerted to questions was  atraumatic.  HEENT:  Head was atraumatic. Mental status:  The patient was alert and  oriented x2 (not to time) __________. Eyes are equal, round  and reactive to  light. Extraocular movements intact. Oropharynx was clear. She had dry  mucous membranes.  LUNGS:  Decreased breath sounds bilaterally.  HEART:  Regular rate and rhythm with no murmurs.  ABDOMEN:  Soft, nontender. No abdominal bruits.  EXTREMITIES:  No clubbing, cyanosis, or edema.  PULSES:  Good pedal pulses bilaterally.  NEUROLOGIC:  Cranial nerves II-XII intact, 5/5 strength upper and lower  extremities.  SKIN:  No rashes. There was cervical lymphadenopathy.   CK-MB was 14.6, myoglobin greater than 500, troponin I 0.05. UA showed  specific gravity 1.034, __________, trace ketones, moderate blood, 30  protein, negative nitrites, trace leukocyte esterase. UA micro shows hyaline  casts, granular casts, __________, white blood cells 3-6, rbc's 3-6. Blood  cultures pending. EKG showed normal sinus rhythm, normal axis. Sodium 120,  potassium 2.8, chloride 95, bicarb 24, BUN 45, creatinine 2.7, blood sugar  134, bili 2.6, alk phos 132, AST 138, ALT 86, protein 5.7, albumin 2.3,  calcium 8.1 corrected to 9.5 with albumin.  White blood cells 12.1, H&H  13.8/30.1, platelets 127, ANC 11.5.   ASSESSMENT AND PLAN:  This is a 66 year old female with:  1.  Weakness. Differential diagnosis includes flu/viral syndrome; transient      ischemic attack/stroke; diabetic ketoacidosis; dehydration; myocardial      infarction; infections including urinary tract infection, pneumonia and      sepsis; rhabdomyolysis. No focal symptoms; therefore, likely TIA/stroke.      Noted to have elevated blood sugar - the patient's blood sugar 162, but      has not eaten for some time. Will check a.c. __________. Unlikely MI      given no chest pain, shortness of breath, normal EKG, and cardiac      enzymes only significant for increased CK and myoglobin. However, will      cycle enzymes. May be secondary to infection. Blood cultures x2, urine      culture, and chest x-ray. UA with leukocyte esterase but the patient      without __________. Also, viral syndrome. Will check __________,      hepatitis panel. May be secondary to rhabdomyolysis or hypokalemia.  2.  Rhabdomyolysis. No history trauma. The patient does have a history only      of __________. Unlikely metabolic as not recurrent, unlikely neurologic,      and no specific drugs such as __________. May be secondary to statin or      fenofibrate. The patient already off her statin. Will hold fenofibrates.      May be secondary to infection. Will check __________, HSV, HIV, CMV. May      be secondary to __________.  Will give fluid as LR then half normal      saline at 250 mL per hour. Foley, check I&Os. Goal is for urine output      of 200 mL per hour until CPK decreases.  3.  Hypokalemia. Etiology unknown. Rhabdomyolysis usually causes      hyperkalemia. May be secondary to renal __________. Replace with p.o. K-      Dur.  4.  Hyponatremia, likely secondary to dehydration. Will rehydrate and see if      resolves. 5.  Elevated CBGs. Will check HBA1c as the patient without a history  of      diabetes currently.  6.  Acute renal failure. Baseline creatinine unknown. Likely acute renal      failure secondary at least partial to rhabdomyolysis. Will treat with IV  fluids. __________. Consider renal consult in a.m. May be secondary to      __________. Will check ANA.  7.  History of coronary artery disease. Continue Toprol-XL. Will check a      fasting lipid panel.  8.  Chronic obstructive pulmonary disease. Chest x-ray and albuterol p.r.n.  9.  Smoking. A smoking cessation consult, nicotine patch.  10. Prophylaxis. SCDs.      Rolm Gala, M.D.     Bennetta Laos  D:  01/21/2006  T:  01/21/2006  Job:  [id number]

## 2011-05-18 NOTE — Consult Note (Signed)
NAMEKIMILA, PAPALEO NO.:  [id number]   MEDICAL RECORD NO.:  [id number]          PATIENT TYPE:  INP   LOCATION:  2116                         FACILITY:  [facility]   PHYSICIAN:  Lina Sar, M.D. [facility]  DATE OF BIRTH:  31-Mar-1945   DATE OF CONSULTATION:  01/29/2006  DATE OF DISCHARGE:                                   CONSULTATION   PROCEDURE:  Upper endoscopy.   INDICATIONS FOR PROCEDURE:  This 66 year old white female has been in the  hospital since January 20, 2006, with multiple medical problems including  air in the biliary tree, later found to be air in the portal system, sepsis,  shock, question of diverticulitis, also Hemoccult positive stool with drop  in hemoglobin and she developed melena early today and hemoglobin dropped to  6 g.  She has been on proton pump inhibitors and nasogastric suction.  She  is undergoing upper endoscopy on emergency basis after transfusions of two  units of packed cells.   ENDOSCOPE:  Fujinon single channel video endoscope.   SEDATION:  Versed 2 mg IV, Fentanyl 40 mcg IV.   FINDINGS:  Fujinon single channel video endoscope passed into the esophagus.  The patient was monitored by pulse oximeter.  Oxygen saturations were 100%  on two liters.  Patient was very cooperative.  Proximate and distal  esophagus was normal.  There were a few erosions due to nasogastric tube  which was removed prior to the procedure.   STOMACH:  Stomach was insufflated with air and was free of blood.  There was  no coffee-ground material or any specks of blood.  Some bilious material was  noted in the proximal stomach.  Rugae folds were normal.  Body of the  stomach appeared normal without evidence of gastritis.  At the pyloric area  and prepyloric antrum was a circular 8 mm in diameter, clean-appearing ulcer  which had a shallow base and raised smooth edges.  It appeared to be an  acute ulcer without evidence of visible vessel.  Surrounding mucosa  was  edematous and somewhat friable.  Pyloric outlet itself was wide open and I  was able to negotiate through the pyloric channel without disturbing the  ulcers.  Biopsies were taken from the edges of the ulcer for H. pylori and  histology.   DUODENUM:  Duodenal bulb and descending duodenum was normal without evidence  of inflammation.   Endoscope was then brought back into the stomach.  Retroflexed, fundus and  cardia appeared normal.  Stomach was then decompressed and procedure was  terminated.  Patient tolerated the procedure well.   IMPRESSION:  1.  Antral gastric ulcer without stigmata of acute bleeding.  2.  Antral gastritis surrounding the ulcer status post biopsies.   PLAN:  The upper GI bleed was most likely related to the bleeding gastric  ulcer which appears benign.  Biopsies are pending.  She will continue on a  proton pump inhibitor, monitor H&H, transfuse as needed.  We will keep the  nasogastric tube out.  We will avoid anticoagulants and NSAIDs.  Lina Sar, M.D. [facility]  Electronically Signed     DB/MEDQ  D:  01/29/2006  T:  01/30/2006  Job:  [id number]

## 2011-05-18 NOTE — Op Note (Signed)
NAMEADALIDA, GARVER                ACCOUNT NO.:  [id number]   MEDICAL RECORD NO.:  [id number]            PATIENT TYPE:   LOCATION:                                 FACILITY:   PHYSICIAN:  Quita Skye. Hart Rochester, M.D.       DATE OF BIRTH:   DATE OF PROCEDURE:  02/04/2006  DATE OF DISCHARGE:                                 OPERATIVE REPORT   PREOPERATIVE DIAGNOSIS:  Expanding infrarenal abdominal aortic aneurysm--  status post sigmoid colectomy with Hartmann pouch for diverticular abscess  72 hours earlier.   PROCEDURES:  1.  Insertion of an aorto bicommon iliac Cook Zenith stent graft via      bilateral common femoral artery cutdown using      1.  A 22 mm x 88 cm main body.      2.  A 12 mm x 71 cm left contralateral limb      3.  A 14 mm x 54 cm right ipsilateral limb graft with completion          angiogram.   SURGEON:  Quita Skye. Hart Rochester, M.D.   FIRST ASSISTANT:  Janetta Hora. Fields, MD   ANESTHESIA:  General endotracheal.   ESTIMATED BLOOD LOSS:  200 mL.   COMPLICATIONS:  None.   DESCRIPTION OF PROCEDURE:  The patient was taken to the operating room  placed in supine position, at which time both groins were prepped with  Betadine scrub and solution and draped in routine sterile manner. The  abdomen was also prepped and draped and isolated with sterile towels and  adhesive drapes to isolate the colostomy bag. Oblique incisions were made in  both inguinal regions; the right side by Dr. Hart Rochester, the left side by Dr.  Darrick Penna. Dr. Darrick Penna will dictate the left surgical procedure. Both common  superficial and profunda femoris arteries were dissected free and encircled  with vessel loops; 6000 units of heparin was given intravenously. The common  femoral arteries were entered bilaterally. Guidewire was passed proximally  and a long 8-mm sheath inserted.   Abdominal aortogram was performed using the pigtail catheter injecting 20 mL  contrast at 20 mL/sec; and this revealed the aorta to be  widely patent with  single renal arteries bilaterally. There was a saccular infrarenal aneurysm  with a neck below the renals, approximately 3 cm in length, and  approximately 18 mm in diameter with a large saccular component extending to  the patient's right side with an approximate 6-cm lumen filled diameter.  Both common iliacs internal and external iliacs were well visualized. A 22  mm x 88 cm main body device was then positioned and deployed using the  fluoroscopy unit for exact deployment. This was deployed just distal to the  renal arteries; and completion angiogram of this portion was performed to  visualize the origin of the hypogastrics and to measure exact lengths using  graduated pigtail catheter.   The contralateral gate was then cannulated utilizing an H-1 catheter; and  following this the 12 mm x 71 cm contralateral limb was deployed with good  positioning. Completion of the main body deployment was performed and on the  right side a 14 mm x 54 cm ipsilateral limb was deployed just proximal to  the right internal iliac artery. Following this, using the CODA balloon the  junctions were all gently dilated and a completion angiogram performed.  There was no evidence of any Endo-leaks, either type 1, 2 or 3.  There was  excellent position proximally and the right internal iliac filled nicely.  The left internal iliac did not fill, but did fill by retrograde flow.  Following this, the sheaths were removed; both femoral arteries were  repaired with continuous 6-0 Prolene sutures. Clamps released; and there was  excellent pulses in the femoral arteries and palpable dorsalis pedis pulses  distally. No protamine was administered. Jackson-Pratt drains were brought  out bilaterally in the thigh to drain the inguinal wounds; and the wounds  were closed in layers with Vicryl in a subcuticular fashion. Sterile  dressing applied. The patient taken to recovery room in satisfactory   condition.           ______________________________  Quita Skye Hart Rochester, M.D.     JDL/MEDQ  D:  02/04/2006  T:  02/04/2006  Job:  [id number]

## 2011-05-18 NOTE — Discharge Summary (Signed)
[name] [name], [name] [name]                ACCOUNT NO.:  [id number]   MEDICAL RECORD NO.:  [id number]          PATIENT TYPE:  INP   LOCATION:  5730                         FACILITY:  [facility]   PHYSICIAN:  Pearlean Brownie, M.D.DATE OF BIRTH:  1945/05/20   DATE OF ADMISSION:  01/20/2006  DATE OF DISCHARGE:                                 DISCHARGE SUMMARY   ADDENDUM:  Have facility consult palliative care for [name] [name].      Rolm Gala, M.D.    ______________________________  Pearlean Brownie, M.D.    [name] [name]  D:  02/21/2006  T:  02/21/2006  Job:  [id number]

## 2011-05-18 NOTE — Consult Note (Signed)
[name] [name], [name] [name] NO.:  [id number]   MEDICAL RECORD NO.:  [id number]          PATIENT TYPE:  INP   LOCATION:  2116                         FACILITY:  [facility]   PHYSICIAN:  Nora Bing, M.D. [facility] OF BIRTH:  1945-02-19   DATE OF CONSULTATION:  01/22/2006  DATE OF DISCHARGE:                                   CONSULTATION   REFERRING PHYSICIAN:  Dr. McDiarmid.   PRIMARY CARE:  [facility].   PRIMARY CARDIOLOGIST:  Dr. Myrtis Ser.   HISTORY OF PRESENT ILLNESS:  A 66 year old woman admitted with biliary  sepsis and referred for assessment of elevated troponin. Ms. Hritz has known  coronary disease, having presented with an acute inferior myocardial  infarction in 2002. Coronary angiography revealed single vessel disease for  which percutaneous intervention of the right coronary artery was performed.  She has done well since that time, essentially without cardiopulmonary  symptoms. She has been seen only for risk factor management. Unfortunately,  she has continued to smoke cigarettes.   She presented on this occasion with generalized weakness, malaise and fever.  She subsequently suffered a respiratory arrest requiring intubation and was  found to have hematobilia. Initial troponin was 0.05 with total CK of 1025  and MB of 11. Subsequently, there has been an increase in troponin to a peak  of 0.24 with a progressive decrease in CPK and CPK-MB.   Past medical history is otherwise notable for subarachnoid hemorrhage  requiring neurosurgical intervention. She has had COPD and hyperlipidemia.   ALLERGY:  An allergy to PENICILLIN is noted.   MEDICATIONS:  Prior to admission included aspirin 325 milligrams daily,  atorvastatin 80 milligrams daily, metoprolol 25 milligrams daily,  fenofibrate 134 milligrams daily and an albuterol inhaler.   SOCIAL HISTORY:  Divorced and lives in [place]; disabled; continues to  smoke two packs of cigarettes per  day; moderate alcohol consumption as well.   FAMILY HISTORY:  Mother died in her 64s due to neoplastic disease; father  died at age 15 due to kidney failure; had history of myocardial infarction.  The remainder of her information including review of systems is unobtainable  due to the fact the patient is intubated.   EXAM:  VITAL SIGNS:  Sedated woman in no acute distress. Temperature is  98.6, heart rate 100 and regular, respirations 12 and controlled, blood  pressure 110/60. Maximal temperature 102. Weight is 75 kg.  HEENT:  Normal oral mucosa.  NECK:  No jugular venous distension.  LUNGS:  Grossly clear.  CARDIAC:  Normal first and second heart sounds; fourth heart sound present;  normal PMI.  ABDOMEN:  Bowel sounds present; soft and nontender; nondistended.  EXTREMITIES:  Distal pulses intact; extremities are warm; no edema.   LABORATORY:  EKG:  Sinus tachycardia; left atrial abnormality; low voltage;  borderline delayed R-wave progression; nondiagnostic inferior Q-waves; minor  nonspecific ST-segment abnormality. Chest x-ray: Minimal scarring at the  left base versus atelectasis.   Other laboratory notable for BUN of 45, creatinine of 2.7, sodium 128,  potassium of 2.8 and mild LFT abnormalities. Cultures negative  to date.   IMPRESSION:  Ms. Grindle presents with apparent sepsis, likely due to a  biliary source. She is stabilized on mechanical ventilation, fluid  resuscitation and antibiotics. Her troponin value is elevated, but in a  nonspecific range. She has a number of possible contributors to a troponin  leak or accumulation, most notably renal insufficiency. Severe hypokalemia  could be causing a myopathy including in the cardiac muscle. Sepsis alone  will cause elevations of troponin to this level as well pulmonary problems.  Accordingly, I think it is unlikely that her laboratory studies reflect an  acute coronary syndrome. No testing for this possibility is warranted at  the  present time. An echocardiogram will be obtained to verify that she  continues to have normal overall left ventricular systolic function.   Control of cardiovascular risk factors not is not an issue at the present  time. It would be useful for her to discontinue cigarette smoking when she  recovers from this acute illness.   Metabolic abnormalities have been corrected to the extent possible. Renal  function has recovered with hydration. We appreciate the request for  consultation and will be happy to follow this patient with you.      Parkton Bing, M.D. [facility]  Electronically Signed     RR/MEDQ  D:  01/22/2006  T:  01/23/2006  Job:  [id number]

## 2011-05-18 NOTE — Consult Note (Signed)
[name] [name], KEN                ACCOUNT NO.:  [id number]   MEDICAL RECORD NO.:  [id number]          PATIENT TYPE:  INP   LOCATION:  2302                         FACILITY:  [facility]   PHYSICIAN:  Quita Skye. Hart Rochester, M.D.  DATE OF BIRTH:  [birth year]   DATE OF CONSULTATION:  02/01/2006  DATE OF DISCHARGE:                                   CONSULTATION   REFERRING PHYSICIAN:  Leighton Roach McDiarmid, M.D.   CHIEF COMPLAINT AND REASON FOR CONSULTATION:  Expanding abdominal aortic  aneurysm with possible rupture on CT scan.   HISTORY OF PRESENT ILLNESS:  This 66 year old female patient was admitted on  January 20, 2006 with dizziness, near-syncope and weakness, and was found to  have some renal insufficiency, possible rhabdomyolysis, hypokalemia,  hyponatremia and sepsis.  Since that time, she developed pulmonary failure  and was on mechanical ventilation for 7 days.  She had some evidence of  pneumonia as well as possible initial cholecystitis, which was eventually  felt unlikely.  It was thought that she may have diverticulitis and has been  treated with broad-spectrum antibiotics by Dr. Orvan Falconer.  Repeat CT scan  this morning compared to the scan done on January 21, 2006 and on January 26, 2006 revealed that she has an infrarenal aortic aneurysm which has grown  from 4 cm to 5.7 cm and there is now fluid or edema surrounding the aneurysm  which was not present on previous scans.  The CT scan was reviewed by me  with the radiologist and I agree that she does have a rapidly enlarging  aneurysm which may be unrelated to her present illness.   PAST MEDICAL HISTORY:  1.  Coronary artery disease with previous myocardial infarction.  2.  Hypertriglyceridemia.  3.  Hypercholesterolemia.  4.  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  5.  Previous surgery includes the cerebral aneurysm clipping.  6.  The patient also has a  history of COPD.   SOCIAL HISTORY:  The patient is divorced and lives in [place] and is  disabled.  She smokes 2 pack of cigarettes per day and has moderate alcohol  consumption.   FAMILY HISTORY:  Mother died in her 30s due to neoplastic disease.  Father  died at age 60 due to kidney failure and had a history of a myocardial  infarction.   ALLERGIES:  LIPITOR and PENICILLIN.   MEDICATIONS:  See current chart.   PHYSICAL EXAM:  VITAL SIGNS:  Blood pressure is 120/70, heart rate is 90,  respirations are 14.  GENERAL:  She is a chronically ill-appearing female who is in no apparent  distress.  She is alert and oriented x3.  NECK:  Her neck is supple 3+ carotid pulses.  UPPER EXTREMITIES:  Upper extremity pulses 3+ bilaterally.  ABDOMEN.  Obese with no pulsatile mass palpable.  She is distended.  There  is no rebound tenderness.  LOWER EXTREMITIES:  She has a 3+ femoral, popliteal and dorsalis pedis  pulses bilaterally.  IMPRESSION:  1.  Enlarging abdominal aortic aneurysm with peri-aneurysm edema or blood,      must rule out rupture.  2.  Sepsis during this admission and possible diverticulitis.  3.  History of coronary artery disease.  4.  Chronic obstructive pulmonary disease.  5.  History of right left brain cerebrovascular accident from stroke from a      cerebral hemorrhage.  6.  Status post pulmonary failure with mechanical ventilation for 7 days      from this illness.   PLAN:  I think the patient should go the operating room to rule out rupture  of this aneurysm which is less rapidly enlarging.  I feel that this is  probably not related to her admission present illness with sepsis, but may  be a complicating factor or just a coincidence.  I discussed this at length  with the patient and her son and the fact that there was a high of risk of  mortality because of her multiple comorbidities and the fact that this may  be ruptured.  We will take the patient urgently to  the operating room for  exploratory laparotomy.           ______________________________  Quita Skye Hart Rochester, M.D.     JDL/MEDQ  D:  02/01/2006  T:  02/02/2006  Job:  [id number]

## 2011-05-18 NOTE — Op Note (Signed)
NAMEANIELLE, HEADRICK                ACCOUNT NO.:  [id number]   MEDICAL RECORD NO.:  [id number]          PATIENT TYPE:  INP   LOCATION:  2302                         FACILITY:  [facility]   PHYSICIAN:  Janetta Hora. Fields, MD  DATE OF BIRTH:  [birth year]   DATE OF PROCEDURE:  02/06/2006  DATE OF DISCHARGE:                                 OPERATIVE REPORT   Substernal cystic output of common ACL and then IV was then E T A N D A N  the left ear medical record number [id number] to the of   OPERATION:  February 04, 2006   PROCEDURE:  Exposure of left femoral artery and repair of left common  femoral artery for access for endovascular stent graft.   PREOPERATIVE DIAGNOSIS:  Abdominal aortic aneurysm.   POSTOPERATIVE DIAGNOSIS:  Abdominal aortic aneurysm.   ANESTHESIA:  General.   SURGEON:  Charles E. Darrick Penna, M.D.   ASSISTANT:  Josephina Gip, M.D.   OPERATIVE FINDINGS:  Moderate atherosclerosis left common femoral artery.   DESCRIPTION OF PROCEDURE:  After obtaining informed consent, the patient was  taken to the operating room.  The patient's abdomen and upper legs were  prepped and draped in the usual sterile fashion. Next, a transverse incision  was made just below the groin crease in the left groin. Incision was carried  down through subcutaneous tissues down to the level of the common femoral  artery.  The common femoral artery. profunda femoris and superficial femoral  arteries were dissected free circumferentially and controlled with vessel  loops.  Next, the introducer needle was placed in the common femoral artery  for access for a stent graft placement.  The remainder of the operative note  for the stent graft placement was dictated by Dr. Hart Rochester.  Next, after all  sheaths and guidewires were removed, the common femoral artery was  controlled with a Henley clamp.  The profunda and superficial femoral  arteries were controlled with vessel loops.  A transverse arteriotomy was  repaired  using interrupted 6-0 Prolene sutures.  Just prior to completion of  the anastomosis this was fore bled, back bled and thoroughly flushed.  Anastomosis was secured.  Clamps were released.  There was a palpable pulse  below the level of the repair immediately. Hemostasis was obtained.  A 10-mm  flat Blake drain was placed in the left groin.  The  subcutaneous  tissues were closed in multiple layers with 2-0 Vicryl suture.  Subcutaneous  tissues was closed with a 3-0 Vicryl suture.  Skin was closed with a 4-0  Vicryl subcuticular stitch.  The patient tolerated the procedure well, and  there were no complications.  The patient had a warm pink, well-perfused  foot at the end of the case.  e.           ______________________________  Janetta Hora. Fields, MD     CEF/MEDQ  D:  02/06/2006  T:  02/07/2006  Job:  [id number]

## 2011-05-18 NOTE — Discharge Summary (Signed)
[place]  Patient:    [name] [name], TRAPANI Visit Number: [id number] MRN: [id number]          Service Type: MED Location: [phone] Attending Physician:  Talitha Givens Dictated by:   Joellyn Rued, P.A.-C. Adm. Date:  08/17/2001 Disc. Date: 08/20/2001   CC:         [facility] at [facility] wtih Dr. Duanne Guess   Referring Physician Discharge [place]  DATE OF BIRTH:  [birth year]  ADMITTING PHYSICIAN:  Luis Abed, M.D.  SUMMARY OF HISTORY:  Ms. Goris is a 66 year old white female who presented to [facility] with arm discomfort associated with diaphoresis.  At the time of presentation, she stated that her arms felt better and she looked stable.  Her EKG in the emergency room looked compatible with unknown age of inferior myocardial infarction and her initial CK was elevated as well as her troponin, thus she was admitted and transferred to [facility] for further evaluation.  She has a history of subarachnoid hemorrhage requiring surgery - i.e. aneurysm clipping of the left MCA.  She still has residual right mild hemiparesis.  She also has a history of hyperlipidemia.  LABORATORY DATA:  Fasting lipids showed a total cholesterol 264, triglycerides 521, HDL 45, LDL was not calculated.  Initial CK was elevated at 636 with MB 126.3 and relative index 19.9, and a troponin 6.14.  Subsequent CKs and troponins were declining in pattern.  Admission H&H was 15.2 and 43.1, MCV slightly elevated at 101.6, normal MCHC and RDW, platelets 326, wbcs 10.8. Sodium 139, potassium 3.8, BUN 14, creatinine 0.5, glucose 115.  Chest x-ray showed an opacity right lung base.  It was felt that she needed to have a two-view chest x-ray.  CT scan was performed.  This showed an old left MCA infarct, no acute abnormality.  There was evidence of prior surgery in the left frontal temporal region.  EKG on admission showed normal sinus rhythm, inferior  Q waves, with slight ST segment elevation.  Subsequent EKGs showed biphasic inferior T waves.  Just prior to discharge on August 21, H&H was 13.1 and 37.6, platelets 288, wbcs 7.9.  Sodium 141, potassium 3.7, BUN 7, creatinine 1.0, glucose 104.  HOSPITAL COURSE:  Ms. Lindvall was transferred to [facility].  She was placed on IV heparin as well as a beta blocker and continued on her aspirin and Lipitor, and also placed on an IV nitroglycerin drip.  Old records were reviewed.  An echocardiogram from March 01, 1999 showed EF greater than 55%, trace TR, pulmonary arterial pressures with abnormal relaxation.  Family Practice Teaching Service saw her in consultation.  On August 20, cardiac catheterization was performed by Dr. Chales Abrahams.  This revealed mild left main disease, 30% proximal LAD, 30-40% aneurysmal diagonal one.  The left circumflex had a bifurcating OM that had mid mild disease.  The ramus had a 30% proximal lesion.  The RCA was dominant with a 70/95% mid lesion with thrombus.  She had a distal 40% RCA lesion.  Ejection fraction was 50% with distal inferior hypokinesis.  Angioplasty stenting was performed to the RCA lesions, reducing the 70% lesion to 10% and the 95% lesion with thrombus to 10% without difficulty.  Post sheath removal and bedrest she was ambulating without difficulty.  She was maintained on Integrilin until the morning of August 21.  She was given hydrocortisone to place on skin irritations.  On August 21 after  reviewing, Dr. Eden Emms felt that she could be discharged home.  DISCHARGE DIAGNOSES: 1. Out-of-hospital inferior myocardial infarction. 2. Cardiac catheterization as described. 3. Status post angioplasty stenting to two right coronary artery lesions. 4. Hypokalemia - resolved. 5. Hematuria noted at the time of discharge (discussed with Dr. Eden Emms and    given permission to discharge). 6. Tobacco use. 7. Hyperlipidemia. 8. History of subarachnoid  hemorrhage with left middle cerebral artery    clipping.  DISPOSITION:  She is discharged home.  MEDICATIONS: 1. She was given new prescriptions for Plavix 75 mg q.d. for four weeks.  She    was instructed when the prescription runs out not to take any more. 2. She was asked to continue aspirin 325 mg q.d. 3. Her lipitor was increased to 40 mg q.h.s. and she was given a new    prescription. 4. Toprol-XL 25 mg q.d. was added to her regimen. 5. Sublingual nitroglycerin as needed.  ACTIVITY:  She was advised no lifting, driving, sexual activity, working, or heavy exertion until seen by the physician.  DIET:  Maintain low salt/fat/cholesterol diet.  WOUND CARE:  If she has any problems with his catheterization site she was instructed to call.  SPECIAL INSTRUCTIONS:  She was advised no smoking or tobacco products. She will need a blood draw in approximately six weeks for fasting lipids and LFTs.  She was also advised to limit her alcohol to two beers or glasses of wine per day.  FOLLOW-UP:  She will call the [facility] to make a two-week appointment with Dr. Duanne Guess and see Dr. Herbert Deaner. on September 4 at 4:30 p.m. It is noted that either Dr. Duanne Guess or Dr. Cheron Every office will need to perform a two-view chest x-ray to further evaluate the nodular opacity at the right lung base that was performed at [facility], since this was not done during her admission. Dictated by:   Joellyn Rued, P.A.-C. Attending Physician:  Talitha Givens DD:  08/20/01 TD:  08/20/01 Job: [id number]

## 2011-05-18 NOTE — H&P (Signed)
[name] [name], [name] [name] NO.:  [id number]   MEDICAL RECORD NO.:  [id number]          PATIENT TYPE:  INP   LOCATION:  0154                         FACILITY:  [facility]   PHYSICIAN:  Lebron Conners, M.D.   DATE OF BIRTH:  [birth year]   DATE OF ADMISSION:  10/25/2006  DATE OF DISCHARGE:                                HISTORY & PHYSICAL   CHIEF COMPLAINT:  Colostomy.   PRESENT ILLNESS:  The patient is a 66 year old white female who has a  colostomy which was done about 8-9 months ago because of ruptured  diverticulitis.  At the time, she was being operated on by Dr. Hart Rochester for  suspicion of a ruptured aortic aneurysm but perforated diverticulitis with  abscess was found.  Subsequent to that, he did stent graft repair of her  aortic aneurysm and that has gone well.  The patient is now fully recovered  from surgery and desires colostomy closure.  She had a colonoscopy  demonstrating no areas of significant stricture or further abnormalities.  She took a bowel prep at home and is admitted to have her colostomy closed.   PAST MEDICAL HISTORY:  Her most significant problem is coronary artery  disease and she has had a stent done.  In January, her ejection fraction was  70% and there were no valvular abnormalities.  The patient was seen in  August by Dr. Myrtis Ser and he cleared her for having the colostomy closure done  without the need for further testing at this time.  She has a history of  smoking with some bronchitis and COPD.  She had a myocardial infarction in  2002.  She continues to smoke.  There was a subarachnoid hemorrhage treated  with middle cerebral artery clip in the past.  She has hyperlipidemia which  is mild. She had elevated blood sugars during her recent hospitalization.  She had acute renal failure during that hospitalization, but she got over  that.  There is a history of a stroke with expressive aphasia.   MEDICATIONS:  Toprol XL 100 mg daily, Centrum  vitamins, iron, Lexapro daily,  Prilosec OTC as needed.  She has been on aspirin in the past that was  stopped for surgery.   ALLERGIES:  She is allergic to PENICILLIN.   PAST SURGICAL HISTORY:  No operations except as mentioned except for  treatment for broken ankles.   SOCIAL HISTORY:  The patient smokes over two packs cigarettes a day.  She  drinks alcoholic beverages occasionally and moderately.  She takes an  albuterol inhaler as needed.  She does not use is this very often.   FAMILY HISTORY:  Her family history and childhood illnesses are  unremarkable.   REVIEW OF SYSTEMS:  She denies recent chest pain, shortness of breath,  palpitations, rapid heart rate.  She takes walks almost daily.  She has no  problems with her bones and joints. No renal problems.  No GI problems  except for the diverticulitis.  The remainder of the review of systems is  unremarkable.   PHYSICAL EXAMINATION:  GENERAL:  The patient  is a pleasant, cheerful lady with a mild expressive  aphasia.  VITAL SIGNS:  The vital signs were unremarkable per nursing staff.  HEENT:  The head, neck, eyes, ears, nose, mouth throat are unremarkable.  CHEST:  Clear to auscultation.  No chest wall tenderness.  HEART:  Rate and rhythm normal.  No murmur or gallop.  The peripheral pulses  were good.  ABDOMEN:  Well healed incision.  No mass or tenderness.  Left sided  colostomy which appeared healthy.  EXTREMITIES: No significant edema.  No skin lesions.  NEUROLOGICAL: No weakness noted.  LYMPH NODES:  Not enlarged in the groin, axilla or neck.   IMPRESSION:  1. Functional colostomy with history of diverticulitis with perforation      and abscess.  2. Peripheral vascular disease, status post aortic aneurysm stent graft.  3. Coronary artery disease, stable.  4. History of stroke with expressive aphasia.  5. COPD, moderate and stable.   PLAN:  Colostomy closure.  The patient accepts the risks of having this   operation. The mentioned complications include respiratory failure, death,  anastomotic leak with the need for the colostomy to be done again, and wound  infection.  We will proceed today.      Lebron Conners, M.D.  Electronically Signed     WB/MEDQ  D:  10/25/2006  T:  10/26/2006  Job:  [id number]

## 2011-06-05 ENCOUNTER — Encounter: Payer: Self-pay | Admitting: Family Medicine

## 2011-06-20 ENCOUNTER — Other Ambulatory Visit: Payer: Self-pay | Admitting: Family Medicine

## 2011-06-20 NOTE — Telephone Encounter (Signed)
Refill request

## 2011-07-10 ENCOUNTER — Other Ambulatory Visit: Payer: Self-pay | Admitting: Family Medicine

## 2011-07-10 DIAGNOSIS — E7800 Pure hypercholesterolemia, unspecified: Secondary | ICD-10-CM

## 2011-07-10 NOTE — Telephone Encounter (Signed)
Refill request

## 2011-07-22 ENCOUNTER — Other Ambulatory Visit: Payer: Self-pay | Admitting: Family Medicine

## 2011-07-22 NOTE — Telephone Encounter (Signed)
Refill request

## 2011-08-12 ENCOUNTER — Other Ambulatory Visit: Payer: Self-pay | Admitting: Family Medicine

## 2011-08-12 NOTE — Telephone Encounter (Signed)
Refill request

## 2011-08-14 NOTE — Telephone Encounter (Signed)
Refill request

## 2011-08-26 ENCOUNTER — Other Ambulatory Visit: Payer: Self-pay | Admitting: Family Medicine

## 2011-08-27 NOTE — Telephone Encounter (Signed)
Refill request

## 2011-09-10 ENCOUNTER — Other Ambulatory Visit: Payer: Self-pay | Admitting: Family Medicine

## 2011-09-10 NOTE — Telephone Encounter (Signed)
Spoke with Pt's daughter.   Pt needs a refill on budisonide and atorvastatin.  Pt contacted pharmacy but nothing has come from that.  Almost out of medication.  Rosalita Chessman

## 2011-09-10 NOTE — Telephone Encounter (Signed)
Refill request

## 2011-09-13 ENCOUNTER — Telehealth: Payer: Self-pay | Admitting: Family Medicine

## 2011-09-13 NOTE — Telephone Encounter (Signed)
Form placed in MD box. Please do as soon as possible.

## 2011-09-13 NOTE — Telephone Encounter (Signed)
States pharmacy cannot fill budesomide b/c it needs a prior auth.  She is almost out  [employer] [phone] ID# [id number]

## 2011-09-13 NOTE — Telephone Encounter (Signed)
Larita Fife,  Do you have a form for this .Shauntay Brunelli, Maryjo Rochester

## 2011-09-14 NOTE — Telephone Encounter (Signed)
Done

## 2011-09-26 ENCOUNTER — Ambulatory Visit (INDEPENDENT_AMBULATORY_CARE_PROVIDER_SITE_OTHER): Payer: Medicare Other | Admitting: Family Medicine

## 2011-09-26 ENCOUNTER — Encounter: Payer: Self-pay | Admitting: Family Medicine

## 2011-09-26 ENCOUNTER — Other Ambulatory Visit: Payer: Self-pay | Admitting: Family Medicine

## 2011-09-26 DIAGNOSIS — Z1231 Encounter for screening mammogram for malignant neoplasm of breast: Secondary | ICD-10-CM

## 2011-09-26 DIAGNOSIS — E7800 Pure hypercholesterolemia, unspecified: Secondary | ICD-10-CM

## 2011-09-26 DIAGNOSIS — F17200 Nicotine dependence, unspecified, uncomplicated: Secondary | ICD-10-CM

## 2011-09-26 DIAGNOSIS — J449 Chronic obstructive pulmonary disease, unspecified: Secondary | ICD-10-CM

## 2011-09-26 DIAGNOSIS — Z7189 Other specified counseling: Secondary | ICD-10-CM

## 2011-09-26 DIAGNOSIS — R569 Unspecified convulsions: Secondary | ICD-10-CM

## 2011-09-26 DIAGNOSIS — Z23 Encounter for immunization: Secondary | ICD-10-CM

## 2011-09-26 LAB — COMPREHENSIVE METABOLIC PANEL
ALT: 19 U/L (ref 0–35)
Alkaline Phosphatase: 67 U/L (ref 39–117)
Glucose, Bld: 100 mg/dL — ABNORMAL HIGH (ref 70–99)
Sodium: 145 mEq/L (ref 135–145)
Total Bilirubin: 0.4 mg/dL (ref 0.3–1.2)
Total Protein: 7.1 g/dL (ref 6.0–8.3)

## 2011-09-26 NOTE — Patient Instructions (Signed)
Flu and pneumovax vaccine today.  If your lab results are normal, I will send you a letter with the results. If abnormal, someone at the clinic will get in touch with you.   Follow-up in 6 months. Bring advanced directives at that time.   Please stop smoking.

## 2011-09-27 ENCOUNTER — Encounter: Payer: Self-pay | Admitting: Family Medicine

## 2011-09-27 NOTE — Assessment & Plan Note (Signed)
Seems to be stable despite being off pulmicort past week.  Another Rx sent to pharmacy. Am not sure why refusing to fill this medication. Advised daughter to let me know if she is still having problems. Stressed importance of being on this controller medication.  Continue albuterol prn and oxygen.

## 2011-09-27 NOTE — Assessment & Plan Note (Signed)
Not ready to quit. Still smoking 1-2 ppd.  Does not want NRT, oral medications even though her smoking prevents her from doing things like going to church (cannot sit through service without cigarette).

## 2011-09-27 NOTE — Progress Notes (Signed)
  Subjective:    Patient ID: [name] [name], female    DOB: July 02, 1945, 66 y.o.   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 Here for f/u.  1. Tobacco, COPD Still smoking 1-2 ppd Occasional wheezing, uses albuterol multiple times daily scheduled. Ran out of pulmicort about a week ago and problems at pharmacy getting this filled. ROS: no fevers, productive cough  2. Seizures None since 2009 Compliant with Keppra Followed by neurologist  Review of Systems Denies headache Denies chest pain, palpitations Denies depression    Objective:   Physical Exam Gen: NAD, wearing oxygen Psych: pleasant, smiles, defers to daughter to answer questions, appropriate to most questions (some times she just doesn't answer and daughter answers for her) CV: RRR, no m/r/g Pulm: decreased BS, CTAB, no w/r/r Abd: NABS, soft, NT, ND Ext: no edema, cyanosis, clubbing Neuro: no focal deficits    Assessment & Plan:

## 2011-09-27 NOTE — Assessment & Plan Note (Signed)
Stable. No seizures since 2009. Continue Keppra. Followed by [facility].

## 2011-09-27 NOTE — Assessment & Plan Note (Signed)
Forgot to bring. Asked them to bring next time or drop them off at our clinic. Currently full code.

## 2011-10-01 ENCOUNTER — Encounter: Payer: Self-pay | Admitting: Cardiology

## 2011-10-01 DIAGNOSIS — K5790 Diverticulosis of intestine, part unspecified, without perforation or abscess without bleeding: Secondary | ICD-10-CM | POA: Insufficient documentation

## 2011-10-01 DIAGNOSIS — I739 Peripheral vascular disease, unspecified: Secondary | ICD-10-CM

## 2011-10-01 DIAGNOSIS — I639 Cerebral infarction, unspecified: Secondary | ICD-10-CM | POA: Insufficient documentation

## 2011-10-01 DIAGNOSIS — I2510 Atherosclerotic heart disease of native coronary artery without angina pectoris: Secondary | ICD-10-CM | POA: Insufficient documentation

## 2011-10-01 DIAGNOSIS — J449 Chronic obstructive pulmonary disease, unspecified: Secondary | ICD-10-CM | POA: Insufficient documentation

## 2011-10-01 DIAGNOSIS — I714 Abdominal aortic aneurysm, without rupture: Secondary | ICD-10-CM | POA: Insufficient documentation

## 2011-10-03 ENCOUNTER — Ambulatory Visit (INDEPENDENT_AMBULATORY_CARE_PROVIDER_SITE_OTHER): Payer: Medicare Other | Admitting: Cardiology

## 2011-10-03 ENCOUNTER — Encounter: Payer: Self-pay | Admitting: Cardiology

## 2011-10-03 DIAGNOSIS — I998 Other disorder of circulatory system: Secondary | ICD-10-CM

## 2011-10-03 DIAGNOSIS — R58 Hemorrhage, not elsewhere classified: Secondary | ICD-10-CM | POA: Insufficient documentation

## 2011-10-03 DIAGNOSIS — I779 Disorder of arteries and arterioles, unspecified: Secondary | ICD-10-CM

## 2011-10-03 DIAGNOSIS — I2510 Atherosclerotic heart disease of native coronary artery without angina pectoris: Secondary | ICD-10-CM

## 2011-10-03 DIAGNOSIS — F17200 Nicotine dependence, unspecified, uncomplicated: Secondary | ICD-10-CM

## 2011-10-03 LAB — POCT I-STAT, CHEM 8
Calcium, Ion: 1.11 — ABNORMAL LOW
Creatinine, Ser: 1.1
Glucose, Bld: 109 — ABNORMAL HIGH
Hemoglobin: 16.7 — ABNORMAL HIGH
Sodium: 142
TCO2: 29

## 2011-10-03 LAB — CBC
HCT: 44.3
HCT: 48.1 — ABNORMAL HIGH
Hemoglobin: 14.9
MCHC: 33.7
MCV: 96.6
MCV: 97.8
Platelets: 227
Platelets: 228
RBC: 4.59
RDW: 13.6
WBC: 10.5
WBC: 11.1 — ABNORMAL HIGH

## 2011-10-03 LAB — POCT I-STAT 3, VENOUS BLOOD GAS (G3P V)
Acid-Base Excess: 4 — ABNORMAL HIGH
Bicarbonate: 32.6 — ABNORMAL HIGH
pH, Ven: 7.326 — ABNORMAL HIGH
pO2, Ven: 19 — CL

## 2011-10-03 LAB — BASIC METABOLIC PANEL WITH GFR
BUN: 8
CO2: 27
Chloride: 108
Creatinine, Ser: 0.99
Glucose, Bld: 127 — ABNORMAL HIGH

## 2011-10-03 LAB — DIFFERENTIAL
Eosinophils Absolute: 0.1
Eosinophils Relative: 1
Lymphs Abs: 1.9
Monocytes Absolute: 0.6

## 2011-10-03 LAB — BASIC METABOLIC PANEL
Calcium: 8.9
GFR calc Af Amer: >60
GFR calc non Af Amer: 56 — ABNORMAL LOW
Potassium: 3.7
Sodium: 142

## 2011-10-03 LAB — POCT CARDIAC MARKERS
CKMB, poc: 2.5
Myoglobin, poc: 63.6
Troponin i, poc: <0.05

## 2011-10-03 LAB — POCT I-STAT 3, ART BLOOD GAS (G3+)
Acid-Base Excess: 1
O2 Saturation: 92
pO2, Arterial: 60 — ABNORMAL LOW

## 2011-10-03 LAB — CARDIAC PANEL(CRET KIN+CKTOT+MB+TROPI)
CK, MB: 3.2
Relative Index: 2.4
Total CK: 136
Troponin I: 0.01

## 2011-10-03 LAB — CK TOTAL AND CKMB (NOT AT ARMC): CK, MB: 2.8

## 2011-10-03 NOTE — Progress Notes (Signed)
HPI The patient is seen today to followup coronary disease.  I saw her last October, 2012.  She had a non-STEMI in 2002.  She received stents to the right coronary artery.  The patient has had good left ventricular function.  He she has severe oxygen-dependent COPD.  Ejection fraction has been normal.  We have not done any significant aggressive testing for several years.  As part of today's evaluation I have reviewed the old records.  I have carefully updated new EMR.  Allergies  Allergen Reactions  . Penicillins     Current Outpatient Prescriptions  Medication Sig Dispense Refill  . albuterol (ACCUNEB) 1.25 MG/3ML nebulizer solution USE 1 VIAL IN NEBULIZER 3 OR 4 TIMES DAILY  300 mL  5  . albuterol (PROVENTIL HFA) 108 (90 BASE) MCG/ACT inhaler Inhale 2 puffs into the lungs every 4 (four) hours as needed.  1 Inhaler  6  . aspirin 81 MG EC tablet Take 81 mg by mouth daily.        Marland Kitchen atorvastatin (LIPITOR) 80 MG tablet TAKE 1/2 TABLET BY MOUTH ONCE DAILY  15 tablet  0  . budesonide (PULMICORT) 0.25 MG/2ML nebulizer solution USE 1 VIAL IN NEBULIZER TWICE DAILY FOR COPD/ASTHMA  120 mL  0  . Calcium Citrate-Vitamin D 500-400 MG-UNIT CHEW Chew 1 tablet by mouth 2 (two) times daily.        Marland Kitchen levETIRAcetam (KEPPRA) 500 MG tablet Take 500 mg by mouth 2 (two) times daily.        . metoprolol tartrate (LOPRESSOR) 25 MG tablet Take 25 mg by mouth daily.        . multivitamin-iron-minerals-folic acid (CENTRUM) chewable tablet Chew 1 tablet by mouth daily.          History   Social History  . Marital Status: Divorced    Spouse Name: N/A    Number of Children: N/A  . Years of Education: N/A   Occupational History  . Not on file.   Social History Main Topics  . Smoking status: Current Everyday Smoker -- 2.0 packs/day for 55 years    Types: Cigarettes  . Smokeless tobacco: Not on file  . Alcohol Use: Not on file  . Drug Use: Not on file  . Sexually Active: Not on file   Other Topics Concern    . Not on file   Social History Narrative   Lives alone in [place]. Daughter [name] [place], [phone]) lives 10 minutes away and takes care of patient regularly, including taking her to appointments, shopping. Son (in [place]) does patient's bills. Christian. But stopped going to church since cannot go that long without cigarette.     No family history on file.  Past Medical History  Diagnosis Date  . TIA (transient ischemic attack)   . CVA (cerebral vascular accident) 1980's    Left middle cerebral artery aneurysm, repaired in 1982, history CVA with residual expressive aphasia  . CAD (coronary artery disease) 2002    Non-STEMI September, 2002, 2 stents RCA  . Diverticulosis     Perforation, colectomy, colostomy, colostomy repair  . Seizure disorder     Result of cerebral aneurysm  . COPD (chronic obstructive pulmonary disease)     Severe, O2 dependent 24 hours per day  . AAA (abdominal aortic aneurysm)     Stent repair February, 2007  . Tobacco abuse   . Ejection fraction     EF 70%, echo, 2007  . Dyslipidemia   . Carotid artery disease  Doppler November, 2011, 0-39% R. ICA, 40-59% LICA, atypical flow in left vertebral artery,  . Ecchymosis     Right hand    Past Surgical History  Procedure Date  . Rca stent 2002    Following NSTEMI  . Abdominal aortic aneurysm repair 2007    Stent grafted.  . Colostomy     Now reversed.     ROS  Patient denies fever, chills, headache, sweats, rash, change in vision, change in hearing, chest pain, cough, nausea vomiting, urinary symptoms.  All other systems are reviewed and are negative. PHYSICAL EXAM Patient is oriented to person time and place.  She is here with her daughter.  Head is atraumatic.  She is wearing oxygen.  There is no jugular venous distention.  Lungs reveal very distant breath sounds.  Cardiac exam reveals an S1-S2.  No clicks or significant murmurs.  Abdomen is soft.  There is no peripheral edema.  Patient  does have an old area of ecchymosis on the dorsum of her right hand. Filed Vitals:   10/03/11 1534  BP: 118/74  Pulse: 67  Height: 5' (1.524 m)  Weight: 133 lb (60.328 kg)    EKG is done today and reviewed by me.  There is sinus rhythm.  Nonspecific ST changes that are not changed ASSESSMENT & PLAN

## 2011-10-03 NOTE — Assessment & Plan Note (Signed)
Unfortunately the patient continues to smoke.  She is counseled to stop.

## 2011-10-03 NOTE — Patient Instructions (Signed)
Your physician wants you to follow-up in: 1 year  You will receive a reminder letter in the mail two months in advance. If you don't receive a letter, please call our office to schedule the follow-up appointment.  Your physician recommends that you continue on your current medications as directed. Please refer to the Current Medication list given to you today.  

## 2011-10-03 NOTE — Assessment & Plan Note (Signed)
The patient has known coronary artery disease.  She is not having significant symptoms.  It is my feeling that there is no need to do other testing at this time.

## 2011-10-03 NOTE — Assessment & Plan Note (Signed)
The patient has carotid artery disease that will have to be followed over time.

## 2011-10-03 NOTE — Assessment & Plan Note (Signed)
During the evaluation today the patient was fixated on the old area of ecchymosis on the back of her right hand.  I explained to her multiple times that this would not hurt her and that there was nothing I can do about it.  She did not seem to fully comprehend.  Her daughter understood completely.  No further workup.

## 2011-10-10 ENCOUNTER — Other Ambulatory Visit: Payer: Self-pay | Admitting: Family Medicine

## 2011-10-10 NOTE — Telephone Encounter (Signed)
Refill request

## 2011-11-04 ENCOUNTER — Other Ambulatory Visit: Payer: Self-pay | Admitting: Family Medicine

## 2011-11-04 NOTE — Telephone Encounter (Signed)
Refill request

## 2011-11-14 ENCOUNTER — Ambulatory Visit
Admission: RE | Admit: 2011-11-14 | Discharge: 2011-11-14 | Disposition: A | Discharge status: Home / Self Care | Payer: Medicare Other | Source: Ambulatory Visit | Attending: Family Medicine | Admitting: Family Medicine

## 2011-11-14 DIAGNOSIS — Z1231 Encounter for screening mammogram for malignant neoplasm of breast: Secondary | ICD-10-CM

## 2011-12-07 ENCOUNTER — Other Ambulatory Visit: Payer: Self-pay | Admitting: Family Medicine

## 2011-12-07 MED ORDER — BUDESONIDE 0.25 MG/2ML IN SUSP: 0.2500 mg | Freq: Two times a day (BID) | RESPIRATORY_TRACT | Status: DC

## 2011-12-07 MED ORDER — BUDESONIDE 0.25 MG/2ML IN SUSP: 0.2500 mg | Freq: Every day | RESPIRATORY_TRACT | Status: DC

## 2011-12-23 ENCOUNTER — Other Ambulatory Visit: Payer: Self-pay | Admitting: Family Medicine

## 2011-12-26 NOTE — Telephone Encounter (Signed)
Refill request

## 2012-01-29 ENCOUNTER — Other Ambulatory Visit: Payer: Self-pay | Admitting: Family Medicine

## 2012-01-29 NOTE — Telephone Encounter (Signed)
Refill request

## 2012-02-06 ENCOUNTER — Telehealth: Payer: Self-pay | Admitting: *Deleted

## 2012-02-06 NOTE — Telephone Encounter (Signed)
PA required for Albuterol for neb. Form placed in MD box.

## 2012-02-07 ENCOUNTER — Other Ambulatory Visit: Payer: Self-pay | Admitting: Family Medicine

## 2012-02-07 NOTE — Telephone Encounter (Signed)
Spoke with daughter and advised that  Dr. Madolyn Frieze will be in clinic this afternoon and she will fill out PA that we received yesterday.  Advised  to try to pay out of pocket for a few vials to get her thru a couple of days while we are waiting on approval. Patient in almost out .

## 2012-02-07 NOTE — Telephone Encounter (Signed)
Daughter is calling to leave a message for Dr. Madolyn Frieze because [facility] has tried for several days to get a refill on Albuterol.  The refill request is in her box and is dated 97/5.

## 2012-02-07 NOTE — Telephone Encounter (Signed)
Filled and given back to Lynn  

## 2012-02-08 NOTE — Telephone Encounter (Signed)
Received notice from  Medicare Part D that albuterol for neb is covered under  Medicare Part B plan. Pharmacy notified and it did go thru.  Message left on daughter's voicemail advising of this.

## 2012-03-01 ENCOUNTER — Other Ambulatory Visit: Payer: Self-pay | Admitting: Family Medicine

## 2012-03-02 NOTE — Telephone Encounter (Signed)
Refill request

## 2012-03-31 ENCOUNTER — Other Ambulatory Visit: Payer: Self-pay | Admitting: Family Medicine

## 2012-04-23 ENCOUNTER — Other Ambulatory Visit: Payer: Self-pay | Admitting: Family Medicine

## 2012-04-23 MED ORDER — ALBUTEROL SULFATE (2.5 MG/3ML) 0.083% IN NEBU: 2.5000 mg | INHALATION_SOLUTION | Freq: Three times a day (TID) | RESPIRATORY_TRACT | Status: DC

## 2012-04-23 NOTE — Telephone Encounter (Signed)
Spoke with daughter [name] [name].  Patient uses albuterol 3 times daily regularly. Helps with her coughing. She also uses budesonide daily.  No significant worsening in breathing.  Patient continues to smoke.   Daughter concerned about declining mental capacity and ability for patient to care for self (patient lives alone).  Advised daughter to make an appointment for evaluation and to discussion social concerns.

## 2012-04-25 ENCOUNTER — Encounter: Payer: Self-pay | Admitting: Family Medicine

## 2012-04-25 ENCOUNTER — Ambulatory Visit (INDEPENDENT_AMBULATORY_CARE_PROVIDER_SITE_OTHER): Payer: Medicare Other | Admitting: Family Medicine

## 2012-04-25 VITALS — BP 139/63 | HR 69 | Temp 98.2°F | Ht 60.0 in | Wt 137.8 lb

## 2012-04-25 DIAGNOSIS — F0390 Unspecified dementia without behavioral disturbance: Secondary | ICD-10-CM

## 2012-04-25 DIAGNOSIS — J449 Chronic obstructive pulmonary disease, unspecified: Secondary | ICD-10-CM

## 2012-04-25 MED ORDER — BUDESONIDE 0.25 MG/2ML IN SUSP: 0.2500 mg | Freq: Two times a day (BID) | RESPIRATORY_TRACT | Status: DC

## 2012-04-25 NOTE — Patient Instructions (Signed)
Increase budesonide to twice a day. She can use with 2 of her albuterol treatments.  Follow-up in 1 month.

## 2012-04-26 DIAGNOSIS — F0390 Unspecified dementia without behavioral disturbance: Secondary | ICD-10-CM | POA: Insufficient documentation

## 2012-04-26 NOTE — Assessment & Plan Note (Signed)
Patient's daughter [name] who is very involved in caring for her mother although she does not live with her is concerned about worsening memory and ability to care for self.  Patient has baseline neurological deficits following her stroke in the 1980s and has residual aphasia. MMSE could not be performed today, but I seriously question the patient's ability to make her own decisions. I had a lengthy discussion with the patient and her daughter. A very independent person, the patient absolutely refuses to consider the idea of going to a nursing home, however, it is becoming increasingly clear that the patient may not be able to care for herself and may be a danger to herself at home where she lives alone in an apartment. [name] does not think living with her will be appropriate since she lives in a 2-story house, will not allow her mother to smoke in her house, and she and her mother have incompatible personalities for living together. I would recommend placement at an assisted living facility versus a nursing home at this time.  The patient denies depression and reports good quality of life. She goes to church but spends most of her day at home. We emphasized how the patient will still be able to do these things, and at the same time probably enjoy more quality time with [name] who spends a significant amount of time caring for her mother (answering phone calls, cleaning her house, etc).   I have asked the patient to think over this possibility and to follow-up in 1 month with [name] when we will be able to discuss the situation further.  In the interim, we will try to schedule appointment at Geriatrics clinic for work-up of dementia, evaluation for capacity, and any recommendations regarding management of this difficulty social situation.

## 2012-04-26 NOTE — Progress Notes (Signed)
  Subjective:    Patient ID: [name] [name], female    DOB: 1945/04/19, 67 y.o.   MRN: [id number]  HPI  1. Severe COPD, on 2L [place]2 at home Uses albuterol three times a day scheduled and budesonide once daily. Denies needing the albuterol more often than three times a day, and daughter also does not think she needs it more often than that.  Patient continues to smoke 2ppd per daughter. Not at all interested in smoking.  She is complaining of cough that is worse during the night. ROS: denies fevers/chills  2. Daughter is concerned about patient's declining memory and ability to care for herself The patient lives alone in an apartment. Over the past several weeks, the daughter Luciana Axe has noticed a decline in her mother's memory and management of her apartment. Some examples:  -The patient calls [name] and asks the same questions. -She does not remember if she has taken her medications and may sometimes take them twice. -She does not remember how much she has smoked, and [name] feels that she smokes more because she forgot that she recently had a cigarette. -Her apartment is in significant more disarray.  -The patient sometimes leaves her apartment dressed inappropriately for where she is going and [name] has had to ask her to go and change an outfit or wear a bra a few times.   Review of Systems     Objective:   Physical Exam Gen: NAD Psych: appropriate to some questions; at baseline, seems to understand more than she can communicate; daughter understands her way of communication well, which responds more to visual cues and writing than speech Pulm: NI WOB  MMSE: could not be performed due to the patient's baseline neurological deficit    Assessment & Plan:

## 2012-04-26 NOTE — Assessment & Plan Note (Signed)
Unchanged severe COPD O2-dependent.  Patient continues to smoke significantly, currently 2 ppd. Patient has no thoughts about quitting.  Will continue albuterol nebs three times a day, which is working well for her. Will increase budesonide nebs to twice a day.

## 2012-04-28 ENCOUNTER — Encounter: Payer: Self-pay | Admitting: Family Medicine

## 2012-04-28 NOTE — Telephone Encounter (Signed)
error 

## 2012-05-12 ENCOUNTER — Emergency Department (HOSPITAL_COMMUNITY): Payer: Medicare Other

## 2012-05-12 ENCOUNTER — Encounter (HOSPITAL_COMMUNITY): Payer: Self-pay | Admitting: *Deleted

## 2012-05-12 ENCOUNTER — Inpatient Hospital Stay (HOSPITAL_COMMUNITY)
Admission: EM | Admit: 2012-05-12 | Discharge: 2012-05-17 | DRG: 493 | Disposition: A | Discharge status: Skilled Nursing Facility | Payer: Medicare Other | Attending: Family Medicine | Admitting: Family Medicine

## 2012-05-12 DIAGNOSIS — R4701 Aphasia: Secondary | ICD-10-CM | POA: Diagnosis present

## 2012-05-12 DIAGNOSIS — I69920 Aphasia following unspecified cerebrovascular disease: Secondary | ICD-10-CM

## 2012-05-12 DIAGNOSIS — S82001A Unspecified fracture of right patella, initial encounter for closed fracture: Secondary | ICD-10-CM | POA: Diagnosis present

## 2012-05-12 DIAGNOSIS — F0390 Unspecified dementia without behavioral disturbance: Secondary | ICD-10-CM | POA: Diagnosis present

## 2012-05-12 DIAGNOSIS — J9819 Other pulmonary collapse: Secondary | ICD-10-CM | POA: Diagnosis present

## 2012-05-12 DIAGNOSIS — W19XXXA Unspecified fall, initial encounter: Secondary | ICD-10-CM | POA: Diagnosis present

## 2012-05-12 DIAGNOSIS — R0902 Hypoxemia: Secondary | ICD-10-CM

## 2012-05-12 DIAGNOSIS — I252 Old myocardial infarction: Secondary | ICD-10-CM

## 2012-05-12 DIAGNOSIS — Y92009 Unspecified place in unspecified non-institutional (private) residence as the place of occurrence of the external cause: Secondary | ICD-10-CM

## 2012-05-12 DIAGNOSIS — S82009A Unspecified fracture of unspecified patella, initial encounter for closed fracture: Principal | ICD-10-CM | POA: Diagnosis present

## 2012-05-12 DIAGNOSIS — J9610 Chronic respiratory failure, unspecified whether with hypoxia or hypercapnia: Secondary | ICD-10-CM | POA: Diagnosis present

## 2012-05-12 DIAGNOSIS — Z9861 Coronary angioplasty status: Secondary | ICD-10-CM

## 2012-05-12 DIAGNOSIS — R062 Wheezing: Secondary | ICD-10-CM

## 2012-05-12 DIAGNOSIS — I2510 Atherosclerotic heart disease of native coronary artery without angina pectoris: Secondary | ICD-10-CM | POA: Diagnosis present

## 2012-05-12 DIAGNOSIS — J4489 Other specified chronic obstructive pulmonary disease: Secondary | ICD-10-CM | POA: Diagnosis present

## 2012-05-12 DIAGNOSIS — G40909 Epilepsy, unspecified, not intractable, without status epilepticus: Secondary | ICD-10-CM | POA: Diagnosis present

## 2012-05-12 DIAGNOSIS — I714 Abdominal aortic aneurysm, without rupture: Secondary | ICD-10-CM | POA: Diagnosis present

## 2012-05-12 DIAGNOSIS — F17200 Nicotine dependence, unspecified, uncomplicated: Secondary | ICD-10-CM

## 2012-05-12 DIAGNOSIS — Z7189 Other specified counseling: Secondary | ICD-10-CM

## 2012-05-12 DIAGNOSIS — R569 Unspecified convulsions: Secondary | ICD-10-CM | POA: Diagnosis present

## 2012-05-12 DIAGNOSIS — Z9981 Dependence on supplemental oxygen: Secondary | ICD-10-CM

## 2012-05-12 DIAGNOSIS — R410 Disorientation, unspecified: Secondary | ICD-10-CM | POA: Diagnosis not present

## 2012-05-12 DIAGNOSIS — J449 Chronic obstructive pulmonary disease, unspecified: Secondary | ICD-10-CM

## 2012-05-12 HISTORY — DX: Anemia, unspecified: D64.9

## 2012-05-12 HISTORY — DX: Essential (primary) hypertension: I10

## 2012-05-12 HISTORY — DX: Adverse effect of unspecified anesthetic, initial encounter: T41.45XA

## 2012-05-12 HISTORY — DX: Peripheral vascular disease, unspecified: I73.9

## 2012-05-12 HISTORY — DX: Unspecified convulsions: R56.9

## 2012-05-12 HISTORY — DX: Shortness of breath: R06.02

## 2012-05-12 HISTORY — DX: Other complications of anesthesia, initial encounter: T88.59XA

## 2012-05-12 LAB — URINALYSIS, ROUTINE W REFLEX MICROSCOPIC
Bilirubin Urine: NEGATIVE
Glucose, UA: NEGATIVE mg/dL
Hgb urine dipstick: NEGATIVE
Specific Gravity, Urine: 1.024 (ref 1.005–1.030)
Urobilinogen, UA: 1 mg/dL (ref 0.0–1.0)
pH: 8 (ref 5.0–8.0)

## 2012-05-12 MED ORDER — OXYCODONE-ACETAMINOPHEN 5-325 MG PO TABS
1.0000 | ORAL_TABLET | Freq: Once | ORAL | Status: AC
  Administered 2012-05-12: 1 via ORAL
  Filled 2012-05-12: qty 1

## 2012-05-12 NOTE — ED Provider Notes (Signed)
History     CSN: [id number]  Arrival date & time 05/12/12  1556   First MD Initiated Contact with Patient 05/12/12 1604      Chief Complaint  Patient presents with  . Fall    (Consider location/radiation/quality/duration/timing/severity/associated sxs/prior treatment) Patient is a 67 y.o. female presenting with fall. The history is provided by the patient and a relative. The history is limited by the condition of the patient.  Fall   the patient is a 67 year old, female, with oxygen-dependent COPD, who has an expressive aphasia after having an aneurysm clipped, approximately 20 years ago.  She is supposed to walk with a walker.  She was not walking with a walker going to her bathroom and she apparently fell.  She has a bruise over right forehead and pain and swelling in her right near.  She denies a headache.  She denies neck pain, back pain.  She has a chronic cough.  She denies pain anywhere except for in her right knee.  Level V caveat applies for expressive a face  Past Medical History  Diagnosis Date  . TIA (transient ischemic attack)   . CVA (cerebral vascular accident) 1980's    Left middle cerebral artery aneurysm, repaired in 1982, history CVA with residual expressive aphasia  . CAD (coronary artery disease) 2002    Non-STEMI September, 2002, 2 stents RCA  . Diverticulosis     Perforation, colectomy, colostomy, colostomy repair  . Seizure disorder     Result of cerebral aneurysm  . COPD (chronic obstructive pulmonary disease)     Severe, O2 dependent 24 hours per day  . AAA (abdominal aortic aneurysm)     Stent repair February, 2007  . Tobacco abuse   . Ejection fraction     EF 70%, echo, 2007  . Dyslipidemia   . Carotid artery disease     Doppler November, 2011, 0-39% R. ICA, 40-59% LICA, atypical flow in left vertebral artery,  . Ecchymosis     Right hand  . Tobacco abuse     Past Surgical History  Procedure Date  . Rca stent 2002    Following NSTEMI  .  Abdominal aortic aneurysm repair 2007    Stent grafted.  . Colostomy     Now reversed.     History reviewed. No pertinent family history.  History  Substance Use Topics  . Smoking status: Current Everyday Smoker -- 2.0 packs/day for 55 years    Types: Cigarettes  . Smokeless tobacco: Not on file  . Alcohol Use: Not on file    OB History    Grav Para Term Preterm Abortions TAB SAB Ect Mult Living                  Review of Systems  Unable to perform ROS   Allergies  Penicillins  Home Medications   Current Outpatient Rx  Name Route Sig Dispense Refill  . ALBUTEROL SULFATE 1.25 MG/3ML IN NEBU  USE 1 VIAL VIA NEBULIZER EVERY 4 HOURS AS NEEDED FOR WHEEZING OR SHORTNESS OF BREATH 300 mL 0  . ALBUTEROL SULFATE HFA 108 (90 BASE) MCG/ACT IN AERS Inhalation Inhale 2 puffs into the lungs every 4 (four) hours as needed. 1 Inhaler 6  . ALBUTEROL SULFATE (2.5 MG/3ML) 0.083% IN NEBU Nebulization Take 3 mLs (2.5 mg total) by nebulization 3 (three) times daily. 75 mL 12  . ASPIRIN 81 MG PO TBEC Oral Take 81 mg by mouth daily.      Marland Kitchen  ATORVASTATIN CALCIUM 80 MG PO TABS  TAKE  1/2 TABLET BY MOUTH DAILY 15 tablet 3  . BUDESONIDE 0.25 MG/2ML IN SUSP Nebulization Take 2 mLs (0.25 mg total) by nebulization 2 (two) times daily. 120 mL 3  . CALCIUM CITRATE-VITAMIN D 500-400 MG-UNIT PO CHEW Oral Chew 1 tablet by mouth 2 (two) times daily.      Marland Kitchen LEVETIRACETAM 500 MG PO TABS Oral Take 500 mg by mouth 2 (two) times daily.      Marland Kitchen METOPROLOL TARTRATE 25 MG PO TABS Oral Take 25 mg by mouth daily.      . CENTRUM PO CHEW Oral Chew 1 tablet by mouth daily.        BP 149/54  Pulse 72  Temp(Src) 98.2 F (36.8 C) (Oral)  SpO2 97%  Physical Exam  Vitals reviewed. Constitutional: She appears well-developed and well-nourished. No distress.  HENT:  Head: Normocephalic.       Tender contusion over right eyebrow  Eyes: Conjunctivae are normal.  Neck: Normal range of motion. Neck supple.       No  cervical tender  Cardiovascular: Normal rate.   No murmur heard. Pulmonary/Chest: Effort normal and breath sounds normal. No respiratory distress. She has no wheezes. She has no rales.       Hacking cough  Abdominal: Soft. There is no tenderness.  Musculoskeletal: She exhibits edema and tenderness.       Tenderness and swelling over right patella, with decreased range of motion at the right knee due to pain  Neurological: She is alert.  Skin: Skin is warm and dry.    ED Course  Procedures (including critical care time) 67 year old, female, with oxygen-dependent COPD, and expressive aphasia for the past 20 years presents with pain in her right knee and right forehead contusion.  Her daughter states that she is at her baseline in terms of her speech and alertness. We will perform a CAT scan of her head x-ray.  Her right knee and do a chest x-ray, and urinalysis to evaluate for injury, and potential contributing cause for her to fall.   Labs Reviewed  URINALYSIS, ROUTINE W REFLEX MICROSCOPIC   No results found.   No diagnosis found.  Spoke with pt and family.  Pt lives alone.  She is oxygen dependent. We agree it would not be wise to send her home by herself on narcotics with knee immobilizer  Spoke with FP resident. He accepts pt for transfer for admission.  FP service will contact ortho for consult.  Family is going to try to get name of pt's orthopedist. They prefer him if available.  MDM  Patellar fx Copd.        Cheri Guppy, MD 05/12/12 [phone]

## 2012-05-12 NOTE — ED Notes (Signed)
Ortho tech, Jon at bedside.

## 2012-05-12 NOTE — ED Notes (Addendum)
Per ems pt is from home, alert and oriented x4. Pt is suppose to use a walker at home to ambulate. But pt was not using the walker, attempting to get into the bathroom. tripped and fell onto her hands and knees. Could not get herself off floor. Called ems. Pt has bruise on forehead, unsure if bruise is old or new. 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 Pt is repetitive in her questioning. Pt is unreliable historian. Daughter reports this is pts baseline.   When ems stood pt up, pt reports right knee pain and unable to bear much weight on it.

## 2012-05-12 NOTE — ED Notes (Signed)
Bed:WA05<BR> Expected date:<BR> Expected time:<BR> Means of arrival:<BR> Comments:<BR>

## 2012-05-12 NOTE — ED Notes (Signed)
MD at bedside. Dr. Caporossi at bedside.  

## 2012-05-13 ENCOUNTER — Inpatient Hospital Stay (HOSPITAL_COMMUNITY): Payer: Medicare Other

## 2012-05-13 ENCOUNTER — Encounter (HOSPITAL_COMMUNITY): Payer: Self-pay | Admitting: Orthopedic Surgery

## 2012-05-13 DIAGNOSIS — I6990 Unspecified sequelae of unspecified cerebrovascular disease: Secondary | ICD-10-CM

## 2012-05-13 DIAGNOSIS — M79609 Pain in unspecified limb: Secondary | ICD-10-CM

## 2012-05-13 DIAGNOSIS — J449 Chronic obstructive pulmonary disease, unspecified: Secondary | ICD-10-CM

## 2012-05-13 DIAGNOSIS — E785 Hyperlipidemia, unspecified: Secondary | ICD-10-CM

## 2012-05-13 LAB — CBC
MCHC: 33.2 g/dL (ref 30.0–36.0)
MCV: 95.8 fL (ref 78.0–100.0)
Platelets: 199 10*3/uL (ref 150–400)
RDW: 12.6 % (ref 11.5–15.5)
WBC: 10 10*3/uL (ref 4.0–10.5)

## 2012-05-13 LAB — CREATININE, SERUM
GFR calc Af Amer: >90 mL/min (ref >90)
GFR calc non Af Amer: 90 mL/min — ABNORMAL LOW (ref >90)

## 2012-05-13 MED ORDER — BUDESONIDE 0.25 MG/2ML IN SUSP
0.2500 mg | Freq: Two times a day (BID) | RESPIRATORY_TRACT | Status: DC
  Administered 2012-05-13 – 2012-05-17 (×8): 0.25 mg via RESPIRATORY_TRACT
  Filled 2012-05-13 (×11): qty 2

## 2012-05-13 MED ORDER — ASPIRIN EC 81 MG PO TBEC
81.0000 mg | DELAYED_RELEASE_TABLET | Freq: Every day | ORAL | Status: DC
  Administered 2012-05-13 – 2012-05-14 (×2): 81 mg via ORAL
  Filled 2012-05-13 (×2): qty 1

## 2012-05-13 MED ORDER — METOPROLOL TARTRATE 25 MG PO TABS
25.0000 mg | ORAL_TABLET | Freq: Every day | ORAL | Status: DC
  Administered 2012-05-13: 25 mg via ORAL
  Filled 2012-05-13 (×2): qty 1

## 2012-05-13 MED ORDER — ALBUTEROL SULFATE (2.5 MG/3ML) 0.083% IN NEBU
2.5000 mg | INHALATION_SOLUTION | RESPIRATORY_TRACT | Status: DC | PRN
  Filled 2012-05-13: qty 3

## 2012-05-13 MED ORDER — ALBUTEROL SULFATE (2.5 MG/3ML) 0.083% IN NEBU: 2.5000 mg | INHALATION_SOLUTION | RESPIRATORY_TRACT | Status: DC | PRN

## 2012-05-13 MED ORDER — LEVETIRACETAM 500 MG PO TABS
500.0000 mg | ORAL_TABLET | Freq: Two times a day (BID) | ORAL | Status: DC
  Administered 2012-05-13 – 2012-05-17 (×9): 500 mg via ORAL
  Filled 2012-05-13 (×11): qty 1

## 2012-05-13 MED ORDER — ALBUTEROL SULFATE (2.5 MG/3ML) 0.083% IN NEBU
1.2500 mg | INHALATION_SOLUTION | RESPIRATORY_TRACT | Status: DC | PRN
  Administered 2012-05-13 (×2): 2.5 mg via RESPIRATORY_TRACT
  Filled 2012-05-13: qty 0.5
  Filled 2012-05-13: qty 3
  Filled 2012-05-13: qty 0.5

## 2012-05-13 MED ORDER — NICOTINE 21 MG/24HR TD PT24
21.0000 mg | MEDICATED_PATCH | Freq: Every day | TRANSDERMAL | Status: DC
  Administered 2012-05-14 – 2012-05-15 (×2): 21 mg via TRANSDERMAL
  Filled 2012-05-13 (×3): qty 1

## 2012-05-13 MED ORDER — ALBUTEROL SULFATE HFA 108 (90 BASE) MCG/ACT IN AERS
2.0000 | INHALATION_SPRAY | Freq: Three times a day (TID) | RESPIRATORY_TRACT | Status: DC
  Filled 2012-05-13: qty 6.7

## 2012-05-13 MED ORDER — ENOXAPARIN SODIUM 40 MG/0.4ML ~~LOC~~ SOLN
40.0000 mg | SUBCUTANEOUS | Status: DC
  Administered 2012-05-13 – 2012-05-14 (×2): 40 mg via SUBCUTANEOUS
  Filled 2012-05-13 (×2): qty 0.4

## 2012-05-13 MED ORDER — ALBUTEROL SULFATE (5 MG/ML) 0.5% IN NEBU
2.5000 mg | INHALATION_SOLUTION | Freq: Three times a day (TID) | RESPIRATORY_TRACT | Status: DC
  Administered 2012-05-13 – 2012-05-17 (×10): 2.5 mg via RESPIRATORY_TRACT
  Filled 2012-05-13 (×12): qty 0.5

## 2012-05-13 MED ORDER — ATORVASTATIN CALCIUM 40 MG PO TABS
40.0000 mg | ORAL_TABLET | Freq: Every day | ORAL | Status: DC
  Administered 2012-05-13 – 2012-05-16 (×3): 40 mg via ORAL
  Filled 2012-05-13 (×5): qty 1

## 2012-05-13 MED ORDER — HYDROCODONE-ACETAMINOPHEN 5-325 MG PO TABS
1.0000 | ORAL_TABLET | ORAL | Status: DC | PRN
  Administered 2012-05-13: 2 via ORAL
  Administered 2012-05-13 (×2): 1 via ORAL
  Administered 2012-05-13: 2 via ORAL
  Administered 2012-05-14 (×2): 1 via ORAL
  Administered 2012-05-15 – 2012-05-16 (×3): 2 via ORAL
  Filled 2012-05-13: qty 2
  Filled 2012-05-13: qty 1
  Filled 2012-05-13: qty 2
  Filled 2012-05-13: qty 1
  Filled 2012-05-13 (×2): qty 2
  Filled 2012-05-13: qty 1
  Filled 2012-05-13: qty 2
  Filled 2012-05-13: qty 1

## 2012-05-13 NOTE — Progress Notes (Signed)
Patient was previously ordered an Albuterol inhaler 3 times a day but patient was unable to perform proper MDI use. Family was at bedside at time of treatment and stated that she has a hard time following commands so she was transitioned over to nebulizer's with Albuterol at the same 3 times a day frequency which is what she does at home per her family members at bedside. Changes were made per RT protocol and patient has benefited well from them with no complications. RT will cont to monitor patient.

## 2012-05-13 NOTE — ED Notes (Signed)
Danielle from [facility] called to arrange for transport of pt to [facility].

## 2012-05-13 NOTE — Consult Note (Signed)
Primary Care Provider: [facility], Marylene Land, MD  Chief Complaint: Right knee pain   History of Present Illness: [name] [name] is a 67 yo CA F with multiple medical issues including COPD for which she requires constant O2, multiple CVA's, previous seizures and dementia who presented to the ED with R knee pain after an unwitnessed fall in her home.  The history is provided by her daughter.  She states that the patient has a tendency to get out of bed on her own and as her mental state is worsening somehow fell on the tile floor in the bathroom.  The patient was brought to the ED for further evaluation, xrays were taken and she was found to have a right patella fracture.   Dr. Shon Baton, orthopaedic surgeon on call was consulted for further evaluation and treatment recommendations.  She was admitted by medicine as she lives alone and given her limited mobility could not be safely discharged.   Patient Active Problem List   Diagnoses   .  HYPERCHOLESTEROLEMIA   .  HYPERTRIGLYCERIDEMIA   .  Tobacco use disorder   .  SEIZURE DISORDER, COMPLEX PARTIAL   .  Advance directive discussed with patient   .  CVA (cerebral vascular accident)   .  COPD (chronic obstructive pulmonary disease)   .  AAA (abdominal aortic aneurysm)   .  CAD (coronary artery disease)   .  Carotid artery disease   .  Dementia    Past Medical History:  Past Medical History   Diagnosis  Date   .  TIA (transient ischemic attack)    .  CVA (cerebral vascular accident)  1980's     Left middle cerebral artery aneurysm, repaired in 1982, history CVA with residual expressive aphasia   .  CAD (coronary artery disease)  2002     Non-STEMI September, 2002, 2 stents RCA   .  Diverticulosis      Perforation, colectomy, colostomy, colostomy repair   .  Seizure disorder      Result of cerebral aneurysm   .  COPD (chronic obstructive pulmonary disease)      Severe, O2 dependent 24 hours per day   .  AAA (abdominal aortic aneurysm)      Stent  repair February, 2007   .  Tobacco abuse    .  Ejection fraction      EF 70%, echo, 2007   .  Dyslipidemia    .  Carotid artery disease      Doppler November, 2011, 0-39% R. ICA, 40-59% LICA, atypical flow in left vertebral artery,   .  Ecchymosis      Right hand   .  Tobacco abuse    .  Hypertension    .  Shortness of breath    .  Seizures     Past Surgical History:  Past Surgical History   Procedure  Date   .  Rca stent  2002     Following NSTEMI   .  Abdominal aortic aneurysm repair  2007     Stent grafted.   .  Colostomy      Now reversed.   .  Colon surgery    .  Brain surgery      Brain aneurysm, has plates and clips    Social History:  History    Social History   .  Marital Status:  Divorced     Spouse Name:  N/A     Number of Children:  N/A   .  Years of Education:  N/A    Social History Main Topics   .  Smoking status:  Current Everyday Smoker -- 2.0 packs/day for 55 years     Types:  Cigarettes   .  Smokeless tobacco:  None   .  Alcohol Use:  None   .  Drug Use:  None   .  Sexually Active:  None    Other Topics  Concern   .  None    Social History Narrative    Lives alone in [place]. Daughter [name] [place], [phone]) lives 10 minutes away and takes care of patient regularly, including taking her to appointments, shopping. Son (in [place]) does patient's bills. Christian. But stopped going to church since cannot go that long without cigarette.    Family History:  History reviewed. No pertinent family history.  Allergies:  Allergies   Allergen  Reactions   .  Penicillins     Current Facility-Administered Medications   Medication  Dose  Route  Frequency  Provider  Last Rate  Last Dose   .  albuterol (PROVENTIL HFA;VENTOLIN HFA) 108 (90 BASE) MCG/ACT inhaler 2 puff  2 puff  Inhalation  TID  Garnetta Buddy, MD     .  albuterol (PROVENTIL) (2.5 MG/3ML) 0.083% nebulizer solution 1.25 mg  1.25 mg  Nebulization  Q4H PRN  Garnetta Buddy, MD      .  aspirin EC tablet 81 mg  81 mg  Oral  Daily  Garnetta Buddy, MD     .  atorvastatin (LIPITOR) tablet 40 mg  40 mg  Oral  q1800  Garnetta Buddy, MD     .  budesonide (PULMICORT) nebulizer solution 0.25 mg  0.25 mg  Nebulization  BID  Garnetta Buddy, MD     .  enoxaparin (LOVENOX) injection 40 mg  40 mg  Subcutaneous  Q24H  Garnetta Buddy, MD     .  HYDROcodone-acetaminophen [facility]) 5-325 MG per tablet 1-2 tablet  1-2 tablet  Oral  Q4H PRN  Garnetta Buddy, MD   1 tablet at 05/13/12 0432   .  levETIRAcetam (KEPPRA) tablet 500 mg  500 mg  Oral  BID  Garnetta Buddy, MD     .  metoprolol tartrate (LOPRESSOR) tablet 25 mg  25 mg  Oral  Daily  Garnetta Buddy, MD     .  nicotine (NICODERM CQ - dosed in mg/24 hours) patch 21 mg  21 mg  Transdermal  Daily  Floydene Flock, MD     .  oxyCODONE-acetaminophen (PERCOCET) 5-325 MG per tablet 1 tablet  1 tablet  Oral  Once  Cheri Guppy, MD   1 tablet at 05/12/12 2331   Review of Systems:  According to the patients daughter; Denies fevers, chills, SOB or chest pain.  Patient is actually much better now in terms of her pain level since getting medication.    Physical Exam: Vital signs stable General:  Alert, difficulty with orientation and reason for being admitted, responds to yes-no questioning Extremities:  Right knee in immobilizer.  Upon removal, significant swelling when compared to the left.  Unable to straighten extremity completely.  No skin compromise.  Sensation and pulses intact in the bilateral LE.  Compartments are soft.  Dorsiflexion and plantarflexion intact.    Radiology: RIGHT KNEE - COMPLETE 4+ VIEW  Comparison: None.  Findings: Transverse patellar fracture noted with overlying soft  tissue  swelling and moderate suprapatellar effusion. There may be  a fat fluid level visualized on the lateral projection but this is  not optimally seen. No dislocation. Atherosclerotic vascular  calcifications  are noted.  IMPRESSION:  Transverse patellar fracture.  Original Report Authenticated By: Harrel Lemon, M.D.   Assessment and Plan: 1.  Transverse patella fracture  xrays reviewed by Dr. Shon Baton.  Plan to contact Dr. Carola Frost for definitive treatment. NPO after midnight tonight.  Continue knee immobilizer at all times.  2. Other medical issues per medicine  All of the families questions were encouraged, addressed and answered.  Dr. Shon Baton has evaluated the patient, has reviewed and agrees with the plan.    Norval Gable PA-C for [facility] [phone]

## 2012-05-13 NOTE — Clinical Social Work Psychosocial (Signed)
     Clinical Social Work Department BRIEF PSYCHOSOCIAL ASSESSMENT 05/13/2012  Patient:  [name] [name],[name] [name]     Account Number:  [id number]     Admit date:  05/12/2012  Clinical Social Worker:  Lourdes Sledge  Date/Time:  05/13/2012 02:52 PM  Referred by:  Physician  Date Referred:  05/13/2012 Referred for  SNF Placement   Other Referral:   Interview type:  Patient Other interview type:   Pt daughter [name] [name]    PSYCHOSOCIAL DATA Living Status:  ALONE Admitted from facility:   Level of care:   Primary support name:  [name] [name] Primary support relationship to patient:  CHILD, ADULT Degree of support available:   Pt lives alone however her daughter states she lives only away and assists her mother when she can.    CURRENT CONCERNS Current Concerns  Post-Acute Placement   Other Concerns:    SOCIAL WORK ASSESSMENT / PLAN CSW received a referral as pt may need SNF placement after surgery. CSW visit pt room and met with pt daughter [name] [name] who states that pt lives alone and pt seems to be neglecting her home. Pt presents with dementia however was also involved in the conversation. Daughter reports that pt smokes 2 packs of cigaretts a day and therefore family will not stay with pt. Daughter stated she believes pt atleast needs ST rehab until family decides whether pt will be able to return home or need LT care. Pt stated she would prefer a facility where she can smoke. CSW informed pt and daughter that all of the facilities are non smoking and there maybe 1 that allows smoking in their outside patio. CSW also informed family that once PT evaluates pt it will give a better idea of what is recommended for pt at dc. Pt daughter agreeable and appreciative of information.   Assessment/plan status:  Psychosocial Support/Ongoing Assessment of Needs Other assessment/ plan:   Information/referral to community resources:   CSW provided her contact information and SNF  list to pt daughter.    PATIENTS/FAMILYS RESPONSE TO PLAN OF CARE: Pt was sitting up in bed alert and with dementia. CSW informed pt and family of MD recommendations. Pt appeared to understand that it may be unsafe for her to return home alone. Daughter is agreeable to search for ST rehab once PT makes recommendations. CSW will continue to follow and fax pt out to facilities in [facility].

## 2012-05-13 NOTE — H&P (Signed)
FMTS Attending Admit Note Patient seen and examined by me today, discussed with resident team and I agree with assessment and plan.  Notwithstanding her aphasia, she appears to be disoriented to place and time.  Pleasant, no apparent distress.  R patella immobilized.  Breathing comfortably on oxygen 1L/min nasal canula.  Plan for SNF placement once patellar fracture recs by ortho service.  Paula Compton, MD

## 2012-05-13 NOTE — ED Notes (Signed)
Report to Bryce, RN of [facility].  

## 2012-05-13 NOTE — Consult Note (Signed)
Agree with above Multiple medical issues - will discuss question of clearance Will require ORIF of patella fx Will discuss definitive fx management with Dr Carola Frost Patient has already eaten today so will make NPO after midnight for possible surgery

## 2012-05-13 NOTE — H&P (Signed)
Family Medicine Teaching [facility] Admission History and Physical  Patient name: [name] [name] Medical record number: [id number] Date of birth: 03-Dec-1945 Age: 67 y.o. Gender: female  Primary Care Provider: [facility], Marylene Land, MD, MD  Chief Complaint: Right knee pain  History of Present Illness: [name] [name] is a 67 y.o. year old female with COPD and multiple CVA's who presented with right knee pain after a mechanical fall in her home today. She fell forward with her arms outstretched and struck her right knee and right side of her face. It was unwitnessed, but the patient was able to call for help. She was determined to have a right patellar fracture. She lives alone and with limited mobility could not be safely discharged. Moreover, the patient has been showing signs of worsening memory recently, and has an upcoming outpatient neuropsychiatric evaluation scheduled. Most of the HPI is provided by her daughter, [name] [name], because [name] [name] has an expressive aphasia secondary to a remote CVA.   Patient Active Problem List  Diagnoses  . HYPERCHOLESTEROLEMIA  . HYPERTRIGLYCERIDEMIA  . Tobacco use disorder  . SEIZURE DISORDER, COMPLEX PARTIAL  . Advance directive discussed with patient  . CVA (cerebral vascular accident)  . COPD (chronic obstructive pulmonary disease)  . AAA (abdominal aortic aneurysm)  . CAD (coronary artery disease)  . Carotid artery disease  . Dementia   Past Medical History: Past Medical History  Diagnosis Date  . TIA (transient ischemic attack)   . CVA (cerebral vascular accident) 1980's    Left middle cerebral artery aneurysm, repaired in 1982, history CVA with residual expressive aphasia  . CAD (coronary artery disease) 2002    Non-STEMI September, 2002, 2 stents RCA  . Diverticulosis     Perforation, colectomy, colostomy, colostomy repair  . Seizure disorder     Result of cerebral aneurysm  . COPD (chronic obstructive pulmonary disease)     Severe,  O2 dependent 24 hours per day  . AAA (abdominal aortic aneurysm)     Stent repair February, 2007  . Tobacco abuse   . Ejection fraction     EF 70%, echo, 2007  . Dyslipidemia   . Carotid artery disease     Doppler November, 2011, 0-39% R. ICA, 40-59% LICA, atypical flow in left vertebral artery,  . Ecchymosis     Right hand  . Tobacco abuse   . Hypertension   . Shortness of breath   . Seizures     Past Surgical History: Past Surgical History  Procedure Date  . Rca stent 2002    Following NSTEMI  . Abdominal aortic aneurysm repair 2007    Stent grafted.  . Colostomy     Now reversed.   . Colon surgery   . Brain surgery     Brain aneurysm, has plates and clips    Social History: History   Social History  . Marital Status: Divorced    Spouse Name: N/A    Number of Children: N/A  . Years of Education: N/A   Social History Main Topics  . Smoking status: Current Everyday Smoker -- 2.0 packs/day for 55 years    Types: Cigarettes  . Smokeless tobacco: None  . Alcohol Use: None  . Drug Use: None  . Sexually Active: None   Other Topics Concern  . None   Social History Narrative   Lives alone in [place]. Daughter [name] [name], [phone]) lives 10 minutes away and takes care of patient regularly, including taking her to  appointments, shopping. Son (in [place]) does patient's bills. Christian. But stopped going to church since cannot go that long without cigarette.     Family History: History reviewed. No pertinent family history.  Allergies: Allergies  Allergen Reactions  . Penicillins     Current Facility-Administered Medications  Medication Dose Route Frequency Provider Last Rate Last Dose  . albuterol (PROVENTIL HFA;VENTOLIN HFA) 108 (90 BASE) MCG/ACT inhaler 2 puff  2 puff Inhalation TID Garnetta Buddy, MD      . albuterol (PROVENTIL) (2.5 MG/3ML) 0.083% nebulizer solution 1.25 mg  1.25 mg Nebulization Q4H PRN Garnetta Buddy, MD      .  aspirin EC tablet 81 mg  81 mg Oral Daily Garnetta Buddy, MD      . atorvastatin (LIPITOR) tablet 40 mg  40 mg Oral q1800 Garnetta Buddy, MD      . budesonide (PULMICORT) nebulizer solution 0.25 mg  0.25 mg Nebulization BID Garnetta Buddy, MD      . enoxaparin (LOVENOX) injection 40 mg  40 mg Subcutaneous Q24H Garnetta Buddy, MD      . HYDROcodone-acetaminophen [facility]) 5-325 MG per tablet 1-2 tablet  1-2 tablet Oral Q4H PRN Garnetta Buddy, MD   1 tablet at 05/13/12 0432  . levETIRAcetam (KEPPRA) tablet 500 mg  500 mg Oral BID Garnetta Buddy, MD      . metoprolol tartrate (LOPRESSOR) tablet 25 mg  25 mg Oral Daily Garnetta Buddy, MD      . nicotine (NICODERM CQ - dosed in mg/24 hours) patch 21 mg  21 mg Transdermal Daily Floydene Flock, MD      . oxyCODONE-acetaminophen (PERCOCET) 5-325 MG per tablet 1 tablet  1 tablet Oral Once Cheri Guppy, MD   1 tablet at 05/12/12 2331   Review Of Systems: Per HPI with the following additions: none   Physical Exam: BP 166/76  Pulse 81  Temp(Src) 97.8 F (36.6 C) (Oral)  Resp 20  SpO2 95% General: elderly WF, non-distressed, responded to yes-no questions HEENT: ecchymosis of right brow, EOMI, PERRLA, OP clear and dry  Heart: S1, S2 normal, no murmur, rub or gallop, regular rate and rhythm Lungs: wheezing, expiratory slowing and decreased breath sounds Abdomen: abdomen is soft without significant tenderness, masses, organomegaly or guarding Extremities: swelling and tenderness right right knee; large ecchymosis on dorsum of right hand, tenderness to palpation of right hand Skin:scattered ecchymoses and abrasions Neurology: CN II-XII grossly intact; right sided grip weakness; expressive aphasia   Labs and Imaging:  Results for orders placed during the hospital encounter of 05/12/12 (from the past 24 hour(s))  URINALYSIS, ROUTINE W REFLEX MICROSCOPIC     Status: Normal   Collection Time   05/12/12  5:02 PM       Component Value Range   Color, Urine YELLOW  YELLOW    APPearance CLEAR  CLEAR    Specific Gravity, Urine 1.024  1.005 - 1.030    pH 8.0  5.0 - 8.0    Glucose, UA NEGATIVE  NEGATIVE (mg/dL)   Hgb urine dipstick NEGATIVE  NEGATIVE    Bilirubin Urine NEGATIVE  NEGATIVE    Ketones, ur NEGATIVE  NEGATIVE (mg/dL)   Protein, ur NEGATIVE  NEGATIVE (mg/dL)   Urobilinogen, UA 1.0  0.0 - 1.0 (mg/dL)   Nitrite NEGATIVE  NEGATIVE    Leukocytes, UA NEGATIVE  NEGATIVE    CT Head:  1. Right basal ganglia lacunar infarcts are new compared the  prior  exam from 2009, but appear old in chronicity.  2. Extensive left MCA distribution encephalomalacia, and old white  matter infarct in the right parietal vertex, stable from the prior  exam.  3. No acute intracranial findings.  Chest X-ray: No acute cardiopulmonary process  Right Knee X-ray: transverse patellar fracture   Assessment and Plan: [name] [name] is a 67 y.o. year old female presenting with a right patellar fracture after a fall at home. She also has right hand and wrist pain that needs further evaluation. Normally, a person with a isolated patellar fracture would not require an inpatient stay. However, she lives alone and was having difficutly caring for herself prior to this fall. She will need a thorough evaluation by PT and OT for placement considerations.   # Right patellar fracture, transverse - occurred 5/13 - knee immobilizer placed - percocet PRN for pain  - consult orthopedic surgery  - consult PT and OT after orthopedic evaluation   # CAD  - continue home ASA, statin, metoprolol  # COPD  - continue home O2 @ 2L, budesonide BID, and albuterol TID  # Tobacco Abuse - smoke 2 PPD - nicotine patch  # Seizure disorder - continue home Keppra  # FENGI - no need for IV access; regular diet  # PPX - lovenox  # Disposition - admit to [facility] Medicine Teaching Service; contact LCSW for placement    ------------------------------------------------------------------------------------------------------------------------------------------------------    Patient seen and examined with PGY 1 and agree with the above assessment and plan.

## 2012-05-13 NOTE — Clinical Social Work Placement (Addendum)
    Clinical Social Work Department CLINICAL SOCIAL WORK PLACEMENT NOTE 05/13/2012  Patient:  [name] [name], [name] [name]  Account Number:  [id number] Admit date:  05/12/2012  Clinical Social Worker:  Theresia Bough, Theresia Majors  Date/time:  05/13/2012 03:37 PM  Clinical Social Work is seeking post-discharge placement for this patient at the following level of care:   SKILLED NURSING   (*CSW will update this form in Epic as items are completed)   05/13/2012  Patient/family provided with [facility] Department of Clinical Social Work's list of facilities offering this level of care within the geographic area requested by the patient (or if unable, by the patient's family).    Patient/family informed of their freedom to choose among providers that offer the needed level of care, that participate in Medicare, Medicaid or managed care program needed by the patient, have an available bed and are willing to accept the patient.    Patient/family informed of [facility]' ownership interest in [facility], as well as of the fact that they are under no obligation to receive care at this facility.  PASARR submitted to EDS on 05/13/2012 PASARR number received from EDS on 05/13/2012  FL2 transmitted to all facilities in geographic area requested by pt/family on  05/14/2012 FL2 transmitted to all facilities within larger geographic area on   Patient informed that his/her managed care company has contracts with or will negotiate with  certain facilities, including the following:     Patient/family informed of bed offers received:  05/15/12 Patient chooses bed at [facility] Physician recommends and patient chooses bed at  [facility]  Patient to be transferred to  on  05/17/12 (JB) Patient to be transferred to facility by 05/17/12 Dorma Russell)  The following physician request were entered in Epic:   Additional Comments:  Existing PASARR #

## 2012-05-14 ENCOUNTER — Encounter (HOSPITAL_COMMUNITY): Payer: Self-pay | Admitting: Physician Assistant

## 2012-05-14 DIAGNOSIS — F0390 Unspecified dementia without behavioral disturbance: Secondary | ICD-10-CM

## 2012-05-14 DIAGNOSIS — Z01810 Encounter for preprocedural cardiovascular examination: Secondary | ICD-10-CM

## 2012-05-14 DIAGNOSIS — J449 Chronic obstructive pulmonary disease, unspecified: Secondary | ICD-10-CM

## 2012-05-14 DIAGNOSIS — I2510 Atherosclerotic heart disease of native coronary artery without angina pectoris: Secondary | ICD-10-CM

## 2012-05-14 DIAGNOSIS — F17200 Nicotine dependence, unspecified, uncomplicated: Secondary | ICD-10-CM

## 2012-05-14 DIAGNOSIS — R0902 Hypoxemia: Secondary | ICD-10-CM | POA: Diagnosis present

## 2012-05-14 DIAGNOSIS — R062 Wheezing: Secondary | ICD-10-CM

## 2012-05-14 DIAGNOSIS — S82001A Unspecified fracture of right patella, initial encounter for closed fracture: Secondary | ICD-10-CM | POA: Diagnosis present

## 2012-05-14 LAB — BASIC METABOLIC PANEL
BUN: 18 mg/dL (ref 6–23)
Chloride: 107 mEq/L (ref 96–112)
GFR calc non Af Amer: 86 mL/min — ABNORMAL LOW (ref >90)
Glucose, Bld: 154 mg/dL — ABNORMAL HIGH (ref 70–99)
Potassium: 4.1 mEq/L (ref 3.5–5.1)
Sodium: 142 mEq/L (ref 135–145)

## 2012-05-14 MED ORDER — WHITE PETROLATUM GEL
Status: AC
  Administered 2012-05-14: 09:00:00
  Filled 2012-05-14: qty 5

## 2012-05-14 MED ORDER — LACTATED RINGERS IV SOLN
INTRAVENOUS | Status: DC
  Administered 2012-05-15: 01:00:00 via INTRAVENOUS

## 2012-05-14 MED ORDER — REGADENOSON 0.4 MG/5ML IV SOLN
0.4000 mg | Freq: Once | INTRAVENOUS | Status: AC
  Administered 2012-05-15: 0.4 mg via INTRAVENOUS
  Filled 2012-05-14: qty 5

## 2012-05-14 MED ORDER — VANCOMYCIN HCL IN DEXTROSE 1-5 GM/200ML-% IV SOLN
1000.0000 mg | INTRAVENOUS | Status: AC
  Administered 2012-05-15: 1000 mg via INTRAVENOUS
  Filled 2012-05-14: qty 200

## 2012-05-14 MED ORDER — METOPROLOL TARTRATE 25 MG PO TABS
25.0000 mg | ORAL_TABLET | Freq: Two times a day (BID) | ORAL | Status: DC
  Administered 2012-05-14 – 2012-05-17 (×6): 25 mg via ORAL
  Filled 2012-05-14 (×8): qty 1

## 2012-05-14 MED ORDER — METOPROLOL TARTRATE 12.5 MG HALF TABLET
12.5000 mg | ORAL_TABLET | Freq: Two times a day (BID) | ORAL | Status: DC
  Filled 2012-05-14: qty 1

## 2012-05-14 NOTE — Progress Notes (Signed)
Spoke with Montez Morita, PA-C about our plans for [facility] tomorrow. Surgery will be postponed for now but they may plan to proceed in afternoon if results/clearance is back by that time. Raylinn Kosar PA-C

## 2012-05-14 NOTE — Progress Notes (Signed)
Daily Progress Note Si Raider. [name] [name], M.D., M.B.A  Family Medicine PGY-1 Pager [phone]  Subjective: Dr. Carola Frost, orthopedic surgeon, present at bedside this AM speaking with patient and family, pt is going to OR tomorrow for ORIF for right patella; daughter would like to talk to anesthesia team about options for epidural vs general anesthesia given hx of difficult extubation  Patient in bed resting comfortably and using fake cigarette to satisfy anxiety; daughter beleives  Objective: Vital signs in last 24 hours: Temp:  [98.1 F (36.7 C)-98.9 F (37.2 C)] 98.1 F (36.7 C) (05/15 0455) Pulse Rate:  [74-81] 79  (05/15 0455) Resp:  [16-18] 18  (05/15 0455) BP: (147-159)/(56-78) 155/56 mmHg (05/15 0455) SpO2:  [92 %-97 %] 92 % (05/15 0732) Weight change:  Last BM Date: 05/12/12  Intake/Output from previous day: 05/14 0701 - 05/15 0700 In: 360 [P.O.:360] Out: -  Intake/Output this shift:   General: elderly WF, non-distressed, responded to yes-no questions  HEENT: ecchymosis of right brow, EOMI, PERRLA, OP clear and dry  Heart: S1, S2 normal, no murmur, rub or gallop, regular rate and rhythm  Lungs: wheezing, expiratory slowing and decreased breath sounds  Abdomen: abdomen is soft without significant tenderness, masses, organomegaly or guarding  Extremities: swelling and tenderness right right knee; large ecchymosis on dorsum of right hand, tenderness to palpation of right hand  Skin:scattered ecchymoses and abrasions  Neurology: CN II-XII grossly intact; right sided grip weakness; expressive aphasia    Lab Results:  Basename 05/13/12 0630  WBC 10.0  HGB 12.9  HCT 38.8  PLT 199   BMET  Basename 05/13/12 0630  NA --  K --  CL --  CO2 --  GLUCOSE --  BUN --  CREATININE 0.64  CALCIUM --    Studies/Results:   Medications: I have reviewed the patient's current medications.  Assessment/Plan: CLEMENCE LENGYEL is a 67 y.o. year old female presenting with a right  patellar fracture after a fall at home. She also has right hand and wrist pain that needs further evaluation. Normally, a person with a isolated patellar fracture would not require an inpatient stay. However, she lives alone and was having difficutly caring for herself prior to this fall. She will need a thorough evaluation by PT and OT for placement considerations.   # Right patellar fracture, transverse - occurred 5/13  - going to surgery tomorrow 5/16, to be performed by Dr. Carola Frost - I will call anesthesia this AM to report daughter's concerns regarding general - (If I do not reach anesthesia and they read this note then know that daughter Luciana Axe can be contacted at [phone])  # CAD  - continue home ASA, statin, metoprolol   # COPD - has increase O2 requirement to 3L, likely 2/2 atelectasis  - continue budesonide BID, and albuterol TID  - start incentive spirometry today   # Tobacco Abuse - smoke 2 PPD  - nicotine patch 21 mg  # Seizure disorder  - continue home Keppra   # FENGI - regular diet, but NPO after midnight; no IV access but anesthesia will probably need access, but I will defer to their preference   # PPX - lovenox   # Disposition - family already working with LCSW for placement after surgery      LOS: 2 days   [name] 05/14/2012, 9:34 AM

## 2012-05-14 NOTE — Consult Note (Signed)
Name: [name] [name] MRN: [id number] DOB: 05-Feb-1945    LOS: 2  [facility] Critical [facility] Note   History of Present Illness:  67 y/o F with extensive PMH including COPD, TIA / CVA with resultant expressive aphasia, CAD, Seizure disorder, AAA, HTN admitted on 5/14 after an unwitnessed fall with R knee pain.  She apparently has had progressively worsening memory, lives alone and has limited mobility.  Evaluation demonstrated R patellar fracture.      Tests / Events: 5/13 Knee>>>Transverse patellar fracture noted with overlying soft tissue swelling and moderate suprapatellar effusion.  No dislocation.  5/13 CXR>>>No acute cardiopulmonary process.    Past Medical History  Diagnosis Date  . TIA (transient ischemic attack)   . CVA (cerebral vascular accident) 1980's    Left middle cerebral artery aneurysm, repaired in 1982 (per chart had hx of SAH), history CVA with residual expressive aphasia  . CAD (coronary artery disease) 2002    Felt to have out-of-hospital inferior MI s/p stents to prox & mid RCA 07/2001 (had brief V-fib in cath lab)  . Diverticulosis     With hx of abscess, perforation, colectomy, colostomy, colostomy repair  . Seizure disorder     Result of cerebral aneurysm  . COPD (chronic obstructive pulmonary disease)     Severe, O2 dependent 24 hours per day (chronic respiratory failure)  . AAA (abdominal aortic aneurysm)     Stent repair February 2007  . Tobacco abuse   . Dyslipidemia   . Carotid artery disease     Doppler November, 2011, 0-39% R. ICA, 40-59% LICA, atypical flow in left vertebral artery,  . Ecchymosis     Right hand  . Tobacco abuse   . Hypertension   . Anemia     Past Surgical History  Procedure Date  . Rca stent 2002    Following NSTEMI  . Abdominal aortic aneurysm repair 2007    Stent grafted.  . Colostomy     Now reversed.   . Colon surgery   . Brain surgery     Brain aneurysm, has plates and clips    Prior to Admission  medications   Medication Sig Start Date End Date Taking? Authorizing Provider  albuterol (ACCUNEB) 1.25 MG/3ML nebulizer solution USE 1 VIAL VIA NEBULIZER EVERY 4 HOURS AS NEEDED FOR WHEEZING OR SHORTNESS OF BREATH 03/31/12  Yes Shelly Rubenstein, MD  albuterol (PROVENTIL HFA;VENTOLIN HFA) 108 (90 BASE) MCG/ACT inhaler Inhale 2 puffs into the lungs every 4 (four) hours as needed. For shortness of breath 03/09/11  Yes Shelly Rubenstein, MD  albuterol (PROVENTIL) (2.5 MG/3ML) 0.083% nebulizer solution Take 3 mLs (2.5 mg total) by nebulization 3 (three) times daily. 04/23/12 04/23/13 Yes Shelly Rubenstein, MD  aspirin 81 MG EC tablet Take 81 mg by mouth daily.     Yes Historical Provider, MD  atorvastatin (LIPITOR) 80 MG tablet TAKE  1/2 TABLET BY MOUTH DAILY 03/01/12  Yes Elsie [place], MD  budesonide (PULMICORT) 0.25 MG/2ML nebulizer solution Take 2 mLs (0.25 mg total) by nebulization 2 (two) times daily. 04/25/12 04/25/13 Yes Shelly Rubenstein, MD  Calcium Citrate-Vitamin D 500-400 MG-UNIT CHEW Chew 1 tablet by mouth 2 (two) times daily.     Yes Historical Provider, MD  levETIRAcetam (KEPPRA) 500 MG tablet Take 500 mg by mouth 2 (two) times daily.     Yes Historical Provider, MD  metoprolol tartrate (LOPRESSOR) 25 MG tablet Take 25 mg by  mouth daily.     Yes Historical Provider, MD  multivitamin-iron-minerals-folic acid (CENTRUM) chewable tablet Chew 1 tablet by mouth daily.     Yes Historical Provider, MD    Allergies Allergies  Allergen Reactions  . Penicillins     Family History Family History  Problem Relation Age of Onset  . Heart disease    . Kidney failure      Social History  reports that she has been smoking Cigarettes.  She has a 110 pack-year smoking history. She does not have any smokeless tobacco history on file. Her alcohol and drug histories not on file.  Review Of Systems:      Vital Signs: Temp:  [97.6 F (36.4 C)-98.9 F (37.2 C)] 97.6 F (36.4 C) (05/15 1300) Pulse  Rate:  [79-95] 95  (05/15 1300) Resp:  [18] 18  (05/15 1300) BP: (124-159)/(56-78) 124/73 mmHg (05/15 1300) SpO2:  [92 %-97 %] 92 % (05/15 1300) I/O last 3 completed shifts: In: 480 [P.O.:480] Out: 1 [Urine:1]  Physical Examination: General: chronically ill, morbidly obese in NAD Neuro: Awake, alert CV: s1s2 rrr, no m/r/g PULM: resp's even/non-labored on 3L /[place], lungs bilaterally clear GI: abd obese/soft, bsx4 active Extremities: warm/dry, no edema   Labs    CBC  Lab 05/13/12 0630  HGB 12.9  HCT 38.8  WBC 10.0  PLT 199     BMET  Lab 05/14/12 1115 05/13/12 0630  NA 142 --  K 4.1 --  CL 107 --  CO2 26 --  GLUCOSE 154* --  BUN 18 --  CREATININE 0.72 0.64  CALCIUM 9.2 --  MG -- --  PHOS -- --      Assessment and Plan: Principal Problem:  *Patella fracture, right, closed, initial encounter Active Problems:  Tobacco use disorder  SEIZURE DISORDER, COMPLEX PARTIAL  COPD (chronic obstructive pulmonary disease)  AAA (abdominal aortic aneurysm)  CAD (coronary artery disease)  S/p fall with R closed Patellar Fracture.   Assessment:  Planned R Patellar fracture repair per Ortho.   Plan: -Per Ortho  Pulmonary Pre-Operative Clearance  Assessment: Hx of COPD, 110 pack year history of smoking and O2 dependence at baseline.  She has multiple co-morbidities that make her very high risk for anesthesia (hx of CVA, HTN, CAD) and likely will have difficulty weaning from mechanical ventilation.  Hx of previous intubations with surgery 5/12.   Plan: -continue O2 -incentive spirometry -cardiac clearance in progress -continue albuterol / pulmicort (home med)   Best practices / Disposition: -->Code Status: Full Code -->DVT Px: lovenox -->GI Px: not indicated -->Diet: PO    Canary Brim, NP-C  Pulmonary & Critical Care Pgr: [phone]  05/14/2012, 2:21 PM  Patient is baseline very debilitated.  She has multiple co-morbidities and has had a very poor outcome  on previous intubation.  Given that this is not a life saving surgery I would recommend conservative management.  If patient wishes to have the surgery understanding the risks and complications would most certainly try to do it without mechanical ventilation (epidural if possible) or after she clearly states her wishes for long term ventilator support as there are very few scenarios I can imagine where this patient will safely be liberated from the ventilator and return back to her previous quality of life.  Patient seen and examined, agree with above note.  I dictated the care and orders written for this patient under my direction.  Koren Bound, M.D. [phone]

## 2012-05-14 NOTE — Progress Notes (Signed)
Orthopaedic Trauma Service Consultation  Reason for Consult: Right patella fracture Referring Physician: Dr. Shon Baton   HPI: [name] [name] is a 67 year old female with multiple medical comorbidities who sustained a fall on 05/13/2012 with resultant her right knee. Patient stated that she fell with her arms outstretched her right knee. Patient was difficult to help she was brought to the hospital for evaluation where she was found to have a right patella fracture. Patient does live alone. Dr. Shon Baton was consult and initially for evaluation of her right patella fracture but given the complexity of the injury Dr. Shon Baton saw consultation from Dr. handy and the orthopedic trauma service for definitive management. Patient also does require home O2 for severe COPD. Status post multiple CVAs as well as non-ST elevation MI 2002 status post stents. She has also had a AAA repair. Currently patient is in room 5009 she is accompanied by family. She is quite pleasant. She is holding a fake cigarette attempting to satisfy her craving said. She also has a nicotine patch on as well. He complains primarily of right knee pain pain elsewhere are noted. Patient denies any numbness or tingling in her right leg. At current time patient denies any chest pain, no nausea or vomiting.   Past Medical History  Diagnosis Date  . TIA (transient ischemic attack)   . CVA (cerebral vascular accident) 1980's    Left middle cerebral artery aneurysm, repaired in 1982, history CVA with residual expressive aphasia  . CAD (coronary artery disease) 2002    Non-STEMI September, 2002, 2 stents RCA  . Diverticulosis     Perforation, colectomy, colostomy, colostomy repair  . Seizure disorder     Result of cerebral aneurysm  . COPD (chronic obstructive pulmonary disease)     Severe, O2 dependent 24 hours per day  . AAA (abdominal aortic aneurysm)     Stent repair February, 2007  . Tobacco abuse   . Ejection fraction     EF 70%, echo, 2007   . Dyslipidemia   . Carotid artery disease     Doppler November, 2011, 0-39% R. ICA, 40-59% LICA, atypical flow in left vertebral artery,  . Ecchymosis     Right hand  . Tobacco abuse   . Hypertension   . Shortness of breath   . Seizures     Past Surgical History  Procedure Date  . Rca stent 2002    Following NSTEMI  . Abdominal aortic aneurysm repair 2007    Stent grafted.  . Colostomy     Now reversed.   . Colon surgery   . Brain surgery     Brain aneurysm, has plates and clips    History reviewed. No pertinent family history.  Social History:  reports that she has been smoking Cigarettes.  She has a 110 pack-year smoking history. She does not have any smokeless tobacco history on file. Her alcohol and drug histories not on file. Patient lives alone  No assistive devices  Patient smokes at least 2 packs per day, home O2 required   Allergies:  Allergies  Allergen Reactions  . Penicillins     Medications:  Prior to Admission:  Prescriptions prior to admission  Medication Sig Dispense Refill  . albuterol (ACCUNEB) 1.25 MG/3ML nebulizer solution USE 1 VIAL VIA NEBULIZER EVERY 4 HOURS AS NEEDED FOR WHEEZING OR SHORTNESS OF BREATH  300 mL  0  . albuterol (PROVENTIL HFA;VENTOLIN HFA) 108 (90 BASE) MCG/ACT inhaler Inhale 2 puffs into the lungs every 4 (four)  hours as needed. For shortness of breath      . albuterol (PROVENTIL) (2.5 MG/3ML) 0.083% nebulizer solution Take 3 mLs (2.5 mg total) by nebulization 3 (three) times daily.  75 mL  12  . aspirin 81 MG EC tablet Take 81 mg by mouth daily.        Marland Kitchen atorvastatin (LIPITOR) 80 MG tablet TAKE  1/2 TABLET BY MOUTH DAILY  15 tablet  3  . budesonide (PULMICORT) 0.25 MG/2ML nebulizer solution Take 2 mLs (0.25 mg total) by nebulization 2 (two) times daily.  120 mL  3  . Calcium Citrate-Vitamin D 500-400 MG-UNIT CHEW Chew 1 tablet by mouth 2 (two) times daily.        Marland Kitchen levETIRAcetam (KEPPRA) 500 MG tablet Take 500 mg by mouth 2  (two) times daily.        . metoprolol tartrate (LOPRESSOR) 25 MG tablet Take 25 mg by mouth daily.        . multivitamin-iron-minerals-folic acid (CENTRUM) chewable tablet Chew 1 tablet by mouth daily.          Results for orders placed during the hospital encounter of 05/12/12 (from the past 48 hour(s))  URINALYSIS, ROUTINE W REFLEX MICROSCOPIC     Status: Normal   Collection Time   05/12/12  5:02 PM      Component Value Range Comment   Color, Urine YELLOW  YELLOW     APPearance CLEAR  CLEAR     Specific Gravity, Urine 1.024  1.005 - 1.030     pH 8.0  5.0 - 8.0     Glucose, UA NEGATIVE  NEGATIVE (mg/dL)    Hgb urine dipstick NEGATIVE  NEGATIVE     Bilirubin Urine NEGATIVE  NEGATIVE     Ketones, ur NEGATIVE  NEGATIVE (mg/dL)    Protein, ur NEGATIVE  NEGATIVE (mg/dL)    Urobilinogen, UA 1.0  0.0 - 1.0 (mg/dL)    Nitrite NEGATIVE  NEGATIVE     Leukocytes, UA NEGATIVE  NEGATIVE  MICROSCOPIC NOT DONE ON URINES WITH NEGATIVE PROTEIN, BLOOD, LEUKOCYTES, NITRITE, OR GLUCOSE <1000 mg/dL.  CBC     Status: Normal   Collection Time   05/13/12  6:30 AM      Component Value Range Comment   WBC 10.0  4.0 - 10.5 (K/uL)    RBC 4.05  3.87 - 5.11 (MIL/uL)    Hemoglobin 12.9  12.0 - 15.0 (g/dL)    HCT 16.1  09.6 - 04.5 (%)    MCV 95.8  78.0 - 100.0 (fL)    MCH 31.9  26.0 - 34.0 (pg)    MCHC 33.2  30.0 - 36.0 (g/dL)    RDW 40.9  81.1 - 91.4 (%)    Platelets 199  150 - 400 (K/uL)   CREATININE, SERUM     Status: Abnormal   Collection Time   05/13/12  6:30 AM      Component Value Range Comment   Creatinine, Ser 0.64  0.50 - 1.10 (mg/dL)    GFR calc non Af Amer 90 (*) >90 (mL/min)    GFR calc Af Amer >90  >90 (mL/min)     Dg Chest 2 View  05/12/2012  *RADIOLOGY REPORT*  Clinical Data: Fall, weakness  CHEST - 2 VIEW  Comparison: 09/02/2008  Findings: Heart size is normal.  The lungs are clear.  Bones are osteopenic but no new compression deformity is identified.  No pleural effusion.  Aortic stent  graft partly visualized.  IMPRESSION:  No acute cardiopulmonary process.  Original Report Authenticated By: Harrel Lemon, M.D.   Ct Head Wo Contrast  05/12/2012  *RADIOLOGY REPORT*  Clinical Data: Fall.  Right forehead confusion.  Chronic expressive aphasia.  CT HEAD WITHOUT CONTRAST  Technique:  Contiguous axial images were obtained from the base of the skull through the vertex without contrast.  Comparison: 09/01/2008  Findings: Large remote left MCA distribution infarct appears stable compared the prior exam.  Similarly, a remote infarct in the right vertex white matter appears unchanged.  Left MCA clip noted along with findings related to the left frontal temporal craniectomy. Small right lentiform nucleus and caudate head lacunar infarcts are new compared the prior exam but appear old in chronicity.  No intracranial hemorrhage, mass lesion, or acute infarction is identified.  IMPRESSION:  1.  Right basal ganglia lacunar infarcts are new compared the prior exam from 2009, but appear old in chronicity. 2.  Extensive left MCA distribution encephalomalacia, and old white matter infarct in the right parietal vertex, stable from the prior exam. 3.  No acute intracranial findings.  Original Report Authenticated By: Dellia Cloud, M.D.   Dg Hand 2 View Right  05/13/2012  *RADIOLOGY REPORT*  Clinical Data: Right hand pain, fall.  RIGHT HAND - 2 VIEW  Comparison: None.  Findings: Previous ORIF distal radius fracture.  Marked narrowing radiocarpal joint.  Degenerative change carpometacarpal joints.  No acute fracture or osseous destructive lesion.  IMPRESSION: Degenerative changes as described.  Original Report Authenticated By: Elsie Stain, M.D.   Dg Knee Complete 4 Views Right  05/12/2012  *RADIOLOGY REPORT*  Clinical Data: Fall, right knee pain and swelling  RIGHT KNEE - COMPLETE 4+ VIEW  Comparison: None.  Findings: Transverse patellar fracture noted with overlying soft tissue swelling and  moderate suprapatellar effusion.  There may be a fat fluid level visualized on the lateral projection but this is not optimally seen.  No dislocation.  Atherosclerotic vascular calcifications are noted.  IMPRESSION: Transverse patellar fracture.  Original Report Authenticated By: Harrel Lemon, M.D.    PE Blood pressure 155/56, pulse 79, temperature 98.1 F (36.7 C), temperature source Oral, resp. rate 18, SpO2 92.00%.  General: Patient is pleasant ears to be fairly comfortable.  Lungs: Decreased  Cardiac : S1 and S2  Abdomen: Soft and nontender  Extremities:  Right lower extremity    There is a swelling noted around the right knee as well as a joint effusion.    Patient is tender with palpation of her patella.    No straight leg raise to be performed    Swelling is stable distally.    Distal motor and sensory functions are intact    Extremity is warm with palpable dorsalis pedis pulse    Other extremities are unremarkable   X-ray right knee demonstrates a transverse intra-articular patellar fracture   EKG : I reviewed with anesthesia they're concerned with new onset of arrhythmia possibly suggestive of A. fib but also artifact.. From previous EKGs there on the computer system.    Assessment/Plan:  67 year old female status post fall  1. Right patella fracture OTA 34-C1  Patient will need operative fixation to reestablish her extensor mechanism.  Plan for the OR tomorrow and will fixed using tension band technique. Would anticipate that her bone was too soft given her chronic diseases as well his nicotine use to allow for screw fixation.  Patient will be weightbearing as tolerated with her leg in full extension after surgery.  We will keep her immobilized for 10-14 days before beginning gentle knee range of motion.  Patient will not be permitted to perform active knee extension for 6-8 weeks but will be permitted to perform passive extension.  2. EKG changes  Patient does  see Dr. Myrtis Ser, and they will see in consult for cardiac clearance  3. decreased bone density  Given patient's chronic diseases including COPD as well as prolonged and nicotine use anticipate very  Soft and weak bone.  Patient needs to optimize her nutrition full recovery and healing.  4. nicotine dependence  Ideally I would like for the nicotine patch to be stopped as this releases a steady stream of nicotine into the system keeping the nicotine levels constantly elevated thereby negatively impacting angiogenesis and compromising healing potential of bone. We can discuss with the medicine service alternatives. If this is strictly for anxiety controlled may consider low-dose benzo or something similar but again we need to use caution given potential respiratory depression further compromising her pulmonary status.  5. medical issues  Per primary team  6. DVT/PE prophylaxis  Short-term Lovenox after surgery 7-10 days 7. FEN  N.p.o. after midnight 8. Disposition  OR tomorrow  Anesthesia consult today  Cards consult today   Mearl Latin, PA-C [facility] [phone] (P) 05/14/2012  9:53 AM

## 2012-05-14 NOTE — Progress Notes (Signed)
Utilization review completed. Anette Guarneri, RN, BSN.  05/14/12

## 2012-05-14 NOTE — Consult Note (Signed)
CARDIOLOGY CONSULT NOTE  Patient ID: [name] [name], MRN: [id number], DOB/AGE: 68-29-1946 68 y.o. Admit date: 05/12/2012   Date of Consult: 05/14/2012 Primary Physician: Priscella Mann, MD, MD Primary Cardiologist: Dr. Myrtis Ser  Chief Complaint: fall, knee pain Reason for Consult: pre-op eval  HPI: 67 y/o F with multiple medical problems including hx of MI 2002 s/p 2 RCA stents, COPD with O2-dependent chronic respiratory failure, CVA/TIA with aphasia, AAA repair presented to [facility] with a fall 5/14 and was found to have a R patellar fracture. The patient has severe expressive aphasia so most of hx is obtained from her daughter. Her daughter was not present during the fall, but due to the injuries it is thought that Ms. Rossy fell near the toilet with hands outstretched following a nap in the afternoon. She bumped her head and struck her knee. The patient denies any CP, SOB, palpitations, presyncope or syncope but is unable to provide any more details. She has also been showing worsening memory recently and had OP neuropsych eval scheduled. CT of the head showed right basal ganglia lacunar infarcts that were new compared the prior exam from 2009, but appeared old in chronicity. Orthopedics has been consulted and have recommeded ORIF of her patellar fx. We are asked to see her for pre-op eval. She does not participate in much activity, but was able to be up walking out and about on Mother's Day 3 days ago without any significant cardiac symptoms. The daughter's biggest concern, appropriately so, is the idea of general anesthesia since the patient has been slow to come off the vent in prior surgeries.   Past Medical History  Diagnosis Date  . TIA (transient ischemic attack)   . CVA (cerebral vascular accident) 1980's    Left middle cerebral artery aneurysm, repaired in 1982, history CVA with residual expressive aphasia  . CAD (coronary artery disease) 2002    Out-of-hospital inferior MI s/p  stents to prox & mid RCA 20-Sep-2001 (had brief V-fib in cath lab)  . Diverticulosis     With hx of abscess, perforation, colectomy, colostomy, colostomy repair  . Seizure disorder     Result of cerebral aneurysm  . COPD (chronic obstructive pulmonary disease)     Severe, O2 dependent 24 hours per day (chronic respiratory failure)  . AAA (abdominal aortic aneurysm)     Stent repair February 2007  . Tobacco abuse   . Dyslipidemia   . Carotid artery disease     Doppler November, 2011, 0-39% R. ICA, 40-59% LICA, atypical flow in left vertebral artery,  . Ecchymosis     Right hand  . Tobacco abuse   . Hypertension   . Anemia       Most Recent Cardiac Studies: Cardiac Catheterization Sep 20, 2001 PROCEDURES PERFORMED:  1. Left heart catheterization.  2. Left ventriculogram.  3. Selective coronary angiography.  4. Percutaneous transluminal coronary angioplasty and stenting of the mid  right coronary artery.  5. AngioJet of the right coronary artery.  6. Direct-current cardioversion.  DIAGNOSES:  1. Severe single-vessel coronary artery disease.  2. Normal left ventricular systolic function.  3. Status post inferior wall myocardial infarction.  4. Ventricular fibrillation.  FINDINGS: Initial findings are as follows:  1. Left main trunk: Mild irregularities.  2. LAD: This is a medium caliber vessel that tapers significantly after the first diagonal branch. The LAD has mild diffuse disease of 30% in the proximal and mid section. The first diagonal branch has a segment of  aneurysmal dilatation in the mid section. There is mild disease of 30-40% in the proximal and mid section of the first diagonal branch.  3. Left circumflex artery: This is a medium caliber vessel that provides  a bifurcating marginal branch in the mid section. There is mild diffuse  disease in the left circumflex system.  4. Ramus intermedius: This is a small caliber vessel with mild disease of 30% in the proximal segment.  5.  Right coronary artery: Dominant. This begins as a medium caliber vessel that provides several RV marginal branches in the posterior descending artery in its distal section as well as a small posterior ventricular branch. The right coronary artery has a moderate stenosis of 70% in the mid section after the proximal bend. There is then a further narrowing of 95% in the mid section near the takeoff of a second RV marginal branch. This is immediately followed by a lucent density consistent with thrombus. A third RV marginal branch is seen to provide the mid and distal inferior wall. The distal RCA has moderate disease of 30-40% with the distal posterior descending artery being a small vessel that provides the proximal inferior wall. The posterior ventricular branch is a trivial vessel with moderate disease.  LEFT VENTRICULOGRAM: Normal end-systolic and end-diastolic dimensions. Overall left ventricular function is well preserved, ejection fraction of greater than 55%. No mitral regurgitation. There is mild hypokinesis of the distal inferior wall. LV pressure 118/0, aortic is 118/60, LVEDP equals 16.  = These findings were reviewed with the patient and we elected to proceed with percutaneous intervention of the right coronary artery.The 6 French sheath was exchanged for a 7 [place] sheath and a 5 French venous sheath was placed in the right groin. The patient was given heparin and Integrilin on a weight-adjusted basis to maintain ACT of approximately 225 seconds. She was also given 345 mg of aminophylline IV. Plavix was administered orally 300 mg. A 7 [place] guide catheter was then used to engage the right coronary artery and a 0.014 inch Patriot wire advanced in the distal RCA. AngioJet was then performed to the right coronary artery. A forward pass at 13 seconds was performed and a reverse pass at 10 seconds. Repeat angiography showed an excellent result with complete resolution of thrombus burden in the mid  section of the right coronary artery. There was no distal compromise of the vessel. A 3.0 x 14 mm Radius stent was then carefully positioned in the mid RCA and deployed. Due to the occlusive nature of the stent, the patient did develop ventricular fibrillation which responded to DC cardioversion. A 2.75 x 12 mm Quantum Ranger balloon was introduced and used to post-dilate the  Radius stent. Two inflations were performed at 14 atmospheres for 30 seconds. Repeat angiography showed moderate residual narrowing of 30% at the area of severe stenosis. A 3.0 x 12 mm NIR Elite stent was then introduced and primarily deployed in the proximal lesion at 14 atmospheres for 30 seconds. There was mild residual waste in the mid section of the stent of approximately 20%. A 3.5 x 12 mm Quantum Ranger balloon was then introduced and used to postdilate the proximal segment of the Radius stent at 12 atmospheres for 30 seconds, as well as the NIR Elite stent placed in the proximal lesion at 14 atmospheres for 30 seconds. Repeat angiography after the administration of intracoronary nitroglycerin showed an excellent result with less than 10% residual narrowing at each stenosis with TIMI-3 flow through the  right coronary artery and distal vasculature. There was no evidence of vessel damage or compromise of flow. Final angiography was performed in various projections confirming TIMI grade 3 flow through the right coronary artery. The guide catheter was then removed and the sheath secured in position. The patient tolerated the procedure well and was transferred to the ward in stable condition. The sheaths will be removed when the ACT returns to normal.  FINAL RESULT: Successful PTCA and stenting of the mid right coronary artery with reduction of sequential 70 and 95% narrowings with placement of a 3.0 x 12 mm NIR Elite stent, followed by a 3.0 x 14 mm Radius stent with less than 10% residual narrowing and TIMI grade 3 flow.  Carotid  Dopplers 10/2010: 0-39% RICA, 40-59% LICA  2D Echo 12/2005: SUMMARY - Left ventricular ejection fraction was estimated to be 70 %. There were no left ventricular regional wall motion abnormalities. - Left atrial size was at the upper limits of normal. - There was a small pericardial effusion posterior to the heart. - There is vigorous LV function with no LVOT gradient. There is a small pericardial effusion. IMPRESSIONS - There is vigorous LV function with no LVOT gradient. There is a small pericardial effusion.   Surgical History:  Past Surgical History  Procedure Date  . Rca stent 2002    Following NSTEMI  . Abdominal aortic aneurysm repair 2007    Stent grafted.  . Colostomy     Now reversed.   . Colon surgery   . Brain surgery     Brain aneurysm, has plates and clips     Home Meds: Prior to Admission medications   Medication Sig Start Date End Date Taking? Authorizing Provider  albuterol (ACCUNEB) 1.25 MG/3ML nebulizer solution USE 1 VIAL VIA NEBULIZER EVERY 4 HOURS AS NEEDED FOR WHEEZING OR SHORTNESS OF BREATH 03/31/12  Yes Shelly Rubenstein, MD  albuterol (PROVENTIL HFA;VENTOLIN HFA) 108 (90 BASE) MCG/ACT inhaler Inhale 2 puffs into the lungs every 4 (four) hours as needed. For shortness of breath 03/09/11  Yes Shelly Rubenstein, MD  albuterol (PROVENTIL) (2.5 MG/3ML) 0.083% nebulizer solution Take 3 mLs (2.5 mg total) by nebulization 3 (three) times daily. 04/23/12 04/23/13 Yes Shelly Rubenstein, MD  aspirin 81 MG EC tablet Take 81 mg by mouth daily.     Yes Historical Provider, MD  atorvastatin (LIPITOR) 80 MG tablet TAKE  1/2 TABLET BY MOUTH DAILY 03/01/12  Yes Elsie [place], MD  budesonide (PULMICORT) 0.25 MG/2ML nebulizer solution Take 2 mLs (0.25 mg total) by nebulization 2 (two) times daily. 04/25/12 04/25/13 Yes Shelly Rubenstein, MD  Calcium Citrate-Vitamin D 500-400 MG-UNIT CHEW Chew 1 tablet by mouth 2 (two) times daily.     Yes Historical Provider, MD  levETIRAcetam (KEPPRA)  500 MG tablet Take 500 mg by mouth 2 (two) times daily.     Yes Historical Provider, MD  metoprolol tartrate (LOPRESSOR) 25 MG tablet Take 25 mg by mouth daily.     Yes Historical Provider, MD  multivitamin-iron-minerals-folic acid (CENTRUM) chewable tablet Chew 1 tablet by mouth daily.     Yes Historical Provider, MD    Inpatient Medications:     . albuterol  2.5 mg Nebulization TID  . atorvastatin  40 mg Oral q1800  . budesonide  0.25 mg Nebulization BID  . enoxaparin  40 mg Subcutaneous Q24H  . levETIRAcetam  500 mg Oral BID  . metoprolol tartrate  12.5  mg Oral BID  . nicotine  21 mg Transdermal Daily  . vancomycin  1,000 mg Intravenous On Call to OR  . white petrolatum      . DISCONTD: albuterol  2 puff Inhalation TID  . DISCONTD: aspirin EC  81 mg Oral Daily  . DISCONTD: metoprolol tartrate  25 mg Oral Daily    Allergies:  Allergies  Allergen Reactions  . Penicillins     History   Social History  . Marital Status: Divorced    Spouse Name: N/A    Number of Children: N/A  . Years of Education: N/A   Occupational History  . Not on file.   Social History Main Topics  . Smoking status: Current Everyday Smoker -- 2.0 packs/day for 55 years    Types: Cigarettes  . Smokeless tobacco: Not on file  . Alcohol Use: Not on file  . Drug Use: Not on file  . Sexually Active: Not on file   Other Topics Concern  . Not on file   Social History Narrative   Lives alone in [place]. Daughter [name] [name], [phone]) lives 10 minutes away and takes care of patient regularly, including taking her to appointments, shopping. Son (in [place]) does patient's bills. Christian. But stopped going to church since cannot go that long without cigarette.      Family History  Problem Relation Age of Onset  . Heart disease    . Kidney failure      Review of Systems: limited given patient's aphasia but able to confirm: General: negative for chills, fever, night sweats Cardiovascular:  see above Dermatological: negative for rash. + hand ecchymosis Respiratory: negative for cough or wheezing Urologic: negative for hematuria Abdominal: negative for nausea, vomiting, diarrhea Neurologic: negative for known syncope. Has baseline aphasia All other systems reviewed and are otherwise negative except as noted above.  Labs:  Lab Results  Component Value Date   WBC 10.0 05/13/2012   HGB 12.9 05/13/2012   HCT 38.8 05/13/2012   MCV 95.8 05/13/2012   PLT 199 05/13/2012     Lab 05/13/12 0630  NA --  K --  CL --  CO2 --  BUN --  CREATININE 0.64  CALCIUM --  PROT --  BILITOT --  ALKPHOS --  ALT --  AST --  GLUCOSE --    Radiology/Studies:  1. Chest 2 View 05/12/2012  *RADIOLOGY REPORT*  Clinical Data: Fall, weakness  CHEST - 2 VIEW  Comparison: 09/02/2008  Findings: Heart size is normal.  The lungs are clear.  Bones are osteopenic but no new compression deformity is identified.  No pleural effusion.  Aortic stent graft partly visualized.  IMPRESSION: No acute cardiopulmonary process.  Original Report Authenticated By: Harrel Lemon, M.D.   2. Ct Head Wo Contrast 05/12/2012  *RADIOLOGY REPORT*  Clinical Data: Fall.  Right forehead confusion.  Chronic expressive aphasia.  CT HEAD WITHOUT CONTRAST  Technique:  Contiguous axial images were obtained from the base of the skull through the vertex without contrast.  Comparison: 09/01/2008  Findings: Large remote left MCA distribution infarct appears stable compared the prior exam.  Similarly, a remote infarct in the right vertex white matter appears unchanged.  Left MCA clip noted along with findings related to the left frontal temporal craniectomy. Small right lentiform nucleus and caudate head lacunar infarcts are new compared the prior exam but appear old in chronicity.  No intracranial hemorrhage, mass lesion, or acute infarction is identified.  IMPRESSION:  1.  Right  basal ganglia lacunar infarcts are new compared the prior exam  from 2009, but appear old in chronicity. 2.  Extensive left MCA distribution encephalomalacia, and old white matter infarct in the right parietal vertex, stable from the prior exam. 3.  No acute intracranial findings.  Original Report Authenticated By: Dellia Cloud, M.D.   3. Dg Hand 2 View Right 05/13/2012  *RADIOLOGY REPORT*  Clinical Data: Right hand pain, fall.  RIGHT HAND - 2 VIEW  Comparison: None.  Findings: Previous ORIF distal radius fracture.  Marked narrowing radiocarpal joint.  Degenerative change carpometacarpal joints.  No acute fracture or osseous destructive lesion.  IMPRESSION: Degenerative changes as described.  Original Report Authenticated By: Elsie Stain, M.D.   4. Knee Complete 4 Views Right 05/12/2012  *RADIOLOGY REPORT*  Clinical Data: Fall, right knee pain and swelling  RIGHT KNEE - COMPLETE 4+ VIEW  Comparison: None.  Findings: Transverse patellar fracture noted with overlying soft tissue swelling and moderate suprapatellar effusion.  There may be a fat fluid level visualized on the lateral projection but this is not optimally seen.  No dislocation.  Atherosclerotic vascular calcifications are noted.  IMPRESSION: Transverse patellar fracture.  Original Report Authenticated By: Harrel Lemon, M.D.   EKG: NSR 85bpm somewhat low voltage precordial leads but no acute changes. No significant change from 10/2011.  Physical Exam: Blood pressure 155/56, pulse 79, temperature 98.1 F (36.7 C), temperature source Oral, resp. rate 18, SpO2 92.00%. General: Well developed WF in no acute distress. Head: Normocephalic, atraumatic, sclera non-icteric, no xanthomas, nares are without discharge.  Neck: Soft L carotid bruit. JVD not elevated. Lungs: Decreased BS throughout, decreased inspiratory effort. Faint crackles R mid lung. No active wheezing. Heart: RRR with S1 S2. No murmurs, rubs, or gallops appreciated. Abdomen: Soft, non-tender, non-distended with normoactive bowel  sounds. No hepatomegaly. No rebound/guarding. No obvious abdominal masses. Msk:  Strength and tone appear normal for age. Extremities: No clubbing or cyanosis. No edema.  Distal pedal pulses are 2+ and equal bilaterally. R knee/LE is in immobilizer brace Neuro: Aphasic. Only able to answer simple yes/no questions but completes commands. Moves all extremities spontaneously. Psych:  Flat affect.   Assessment and Plan:  1. Fall of unclear mechanism s/p R patellar fx - pending ORIF. 2. H/o CVA/severe aphasia with newly recognized but chronic-appearing strokes on CT - patient was due to have neuropsych eval as OP, would recommend keeping that appointment. 3. Known CAD with inferior MI 2002 s/p 2 RCA stents - will increase metoprolol to 25mg  BID (HR, BP up). Continue statin. ASA was already discontinued this AM - would recommend to continue as soon as felt appropriate by ortho surgery. See below re: thoughts on pre-op ischemic evaluation. 4. Chronic respiratory failure/O2-dependent COPD with hx of difficult vent-weaning - this may be the more prohibitive issue in regards to her fitness for surgery. Agree with daughter's desire to speak to anesthesia regarding ?epidural instead of general. Would also recommend pulm eval prior to surgery. 5. PVD including AAA repair and known carotid dz - due for repeat carotid dopplers, can defer as outpatient. 6. Hyperglycemia  - do not see a hx of DM. Consider addition of CBG checks/SSI.  Signed, [name] Spies PA-C 05/14/2012, 10:58 AM  Patient seen with PA, agree with above note.  She is inactive in general and exerts herself < 4 METS.  She has known CAD.  No recent chest pain.  Given her sedentary lifestyle, I think that risk stratification with [place]  myoview pre-operatively is indicated. She is not actively wheezing so should tolerate Lexiscan.  Unless she has a large reversible defect, she should be stable for surgery from a cardiac standpoint.  As above, would  increase metoprolol to 25 mg bid.  I think that her greater peri-operative risk comes from her COPD.  She has been hard to wean from the vent in the past.  I think that a pulmonary consult pre-operatively would be reasonable.  Would also look into whether the operation could be done under spinal anesthesia (versus general).   Marca Ancona 05/14/2012 12:18 PM

## 2012-05-14 NOTE — Care Management Note (Signed)
    Page 1 of 1   05/14/2012     10:36:50 AM   CARE MANAGEMENT NOTE 05/14/2012  Patient:  Bourdeau,Janelys L   Account Number:  [id number]  Date Initiated:  05/14/2012  Documentation initiated by:  Anette Guarneri  Subjective/Objective Assessment:   Admitted  s/p fall w/fx righ patella, COPD  lives @ home alone, daughter lives away and frequently helps patient  patient is dementia at baseline     Action/Plan:   assess for SNF need at d/c   Anticipated DC Date:  05/20/2012   Anticipated DC Plan:  SKILLED NURSING FACILITY  In-house referral  Clinical Social Worker      DC Planning Services  CM consult      Choice offered to / List presented to:             Status of service:  In process, will continue to follow Medicare Important Message given?   (If response is "NO", the following Medicare IM given date fields will be blank) Date Medicare IM given:   Date Additional Medicare IM given:    Discharge Disposition:    Per UR Regulation:  Reviewed for med. necessity/level of care/duration of stay  If discussed at Long Length of Stay Meetings, dates discussed:    Comments:

## 2012-05-15 ENCOUNTER — Encounter (HOSPITAL_COMMUNITY): Payer: Self-pay | Admitting: Anesthesiology

## 2012-05-15 ENCOUNTER — Inpatient Hospital Stay (HOSPITAL_COMMUNITY): Payer: Medicare Other | Admitting: Anesthesiology

## 2012-05-15 ENCOUNTER — Other Ambulatory Visit (HOSPITAL_COMMUNITY): Payer: Medicare Other

## 2012-05-15 ENCOUNTER — Inpatient Hospital Stay (HOSPITAL_COMMUNITY): Payer: Medicare Other

## 2012-05-15 ENCOUNTER — Encounter (HOSPITAL_COMMUNITY)
Admission: EM | Disposition: A | Discharge status: Skilled Nursing Facility | Payer: Self-pay | Source: Home / Self Care | Attending: Family Medicine

## 2012-05-15 DIAGNOSIS — I2510 Atherosclerotic heart disease of native coronary artery without angina pectoris: Secondary | ICD-10-CM

## 2012-05-15 DIAGNOSIS — S82009A Unspecified fracture of unspecified patella, initial encounter for closed fracture: Principal | ICD-10-CM

## 2012-05-15 DIAGNOSIS — I714 Abdominal aortic aneurysm, without rupture: Secondary | ICD-10-CM

## 2012-05-15 HISTORY — PX: PATELLA FRACTURE SURGERY: SHX735

## 2012-05-15 HISTORY — PX: ORIF PATELLA: SHX5033

## 2012-05-15 LAB — BASIC METABOLIC PANEL
BUN: 17 mg/dL (ref 6–23)
CO2: 25 mEq/L (ref 19–32)
Calcium: 9.3 mg/dL (ref 8.4–10.5)
Glucose, Bld: 101 mg/dL — ABNORMAL HIGH (ref 70–99)
Potassium: 3.7 mEq/L (ref 3.5–5.1)
Sodium: 143 mEq/L (ref 135–145)

## 2012-05-15 LAB — CBC
HCT: 38.3 % (ref 36.0–46.0)
Hemoglobin: 12.6 g/dL (ref 12.0–15.0)
MCH: 32.2 pg (ref 26.0–34.0)
MCHC: 32.9 g/dL (ref 30.0–36.0)
RBC: 3.91 MIL/uL (ref 3.87–5.11)

## 2012-05-15 LAB — SURGICAL PCR SCREEN: Staphylococcus aureus: NEGATIVE

## 2012-05-15 SURGERY — OPEN REDUCTION INTERNAL FIXATION (ORIF) PATELLA
Anesthesia: Choice | Site: Knee | Laterality: Right | Wound class: Clean

## 2012-05-15 MED ORDER — PHENYLEPHRINE HCL 10 MG/ML IJ SOLN
INTRAMUSCULAR | Status: DC | PRN
  Administered 2012-05-15 (×2): 80 ug via INTRAVENOUS

## 2012-05-15 MED ORDER — BUPIVACAINE HCL 0.75 % IJ SOLN
INTRAMUSCULAR | Status: DC | PRN
  Administered 2012-05-15: 1.5 mL

## 2012-05-15 MED ORDER — FENTANYL CITRATE 0.05 MG/ML IJ SOLN
INTRAMUSCULAR | Status: DC | PRN
  Administered 2012-05-15 (×4): 25 ug via INTRAVENOUS

## 2012-05-15 MED ORDER — METOCLOPRAMIDE HCL 10 MG PO TABS: 5.0000 mg | ORAL_TABLET | Freq: Three times a day (TID) | ORAL | Status: DC | PRN

## 2012-05-15 MED ORDER — MORPHINE SULFATE 2 MG/ML IJ SOLN
1.0000 mg | INTRAMUSCULAR | Status: DC | PRN
  Filled 2012-05-15: qty 1

## 2012-05-15 MED ORDER — METHOCARBAMOL 500 MG PO TABS
500.0000 mg | ORAL_TABLET | Freq: Four times a day (QID) | ORAL | Status: DC | PRN
  Filled 2012-05-15: qty 1

## 2012-05-15 MED ORDER — ONDANSETRON HCL 4 MG/2ML IJ SOLN: 4.0000 mg | Freq: Four times a day (QID) | INTRAMUSCULAR | Status: DC | PRN

## 2012-05-15 MED ORDER — TECHNETIUM TC 99M TETROFOSMIN IV KIT
30.0000 | PACK | Freq: Once | INTRAVENOUS | Status: AC | PRN
  Administered 2012-05-15: 30 via INTRAVENOUS

## 2012-05-15 MED ORDER — ONDANSETRON HCL 4 MG/2ML IJ SOLN
INTRAMUSCULAR | Status: DC | PRN
  Administered 2012-05-15: 4 mg via INTRAVENOUS

## 2012-05-15 MED ORDER — VANCOMYCIN HCL IN DEXTROSE 1-5 GM/200ML-% IV SOLN
1000.0000 mg | Freq: Two times a day (BID) | INTRAVENOUS | Status: AC
  Administered 2012-05-16: 1000 mg via INTRAVENOUS
  Filled 2012-05-15: qty 200

## 2012-05-15 MED ORDER — ONDANSETRON HCL 4 MG/2ML IJ SOLN: 4.0000 mg | Freq: Once | INTRAMUSCULAR | Status: DC | PRN

## 2012-05-15 MED ORDER — HALOPERIDOL LACTATE 5 MG/ML IJ SOLN: 2.0000 mg | Freq: Four times a day (QID) | INTRAMUSCULAR | Status: DC | PRN

## 2012-05-15 MED ORDER — METHOCARBAMOL 100 MG/ML IJ SOLN
500.0000 mg | Freq: Four times a day (QID) | INTRAMUSCULAR | Status: DC | PRN
  Filled 2012-05-15: qty 5

## 2012-05-15 MED ORDER — 0.9 % SODIUM CHLORIDE (POUR BTL) OPTIME
TOPICAL | Status: DC | PRN
  Administered 2012-05-15: 1000 mL

## 2012-05-15 MED ORDER — HYDROMORPHONE HCL PF 1 MG/ML IJ SOLN: 0.2500 mg | INTRAMUSCULAR | Status: DC | PRN

## 2012-05-15 MED ORDER — ONDANSETRON HCL 4 MG PO TABS: 4.0000 mg | ORAL_TABLET | Freq: Four times a day (QID) | ORAL | Status: DC | PRN

## 2012-05-15 MED ORDER — PHENYLEPHRINE HCL 10 MG/ML IJ SOLN
10.0000 mg | INTRAVENOUS | Status: DC | PRN
  Administered 2012-05-15: 10 ug/min via INTRAVENOUS

## 2012-05-15 MED ORDER — LACTATED RINGERS IV SOLN
INTRAVENOUS | Status: DC | PRN
  Administered 2012-05-15: 16:00:00 via INTRAVENOUS

## 2012-05-15 MED ORDER — METOCLOPRAMIDE HCL 5 MG/ML IJ SOLN: 5.0000 mg | Freq: Three times a day (TID) | INTRAMUSCULAR | Status: DC | PRN

## 2012-05-15 MED ORDER — ENOXAPARIN SODIUM 40 MG/0.4ML ~~LOC~~ SOLN
40.0000 mg | SUBCUTANEOUS | Status: DC
  Administered 2012-05-16 – 2012-05-17 (×2): 40 mg via SUBCUTANEOUS
  Filled 2012-05-15 (×3): qty 0.4

## 2012-05-15 MED ORDER — TECHNETIUM TC 99M TETROFOSMIN IV KIT
10.0000 | PACK | Freq: Once | INTRAVENOUS | Status: AC | PRN
  Administered 2012-05-15: 10 via INTRAVENOUS

## 2012-05-15 MED ORDER — PROPOFOL 10 MG/ML IV EMUL
INTRAVENOUS | Status: DC | PRN
  Administered 2012-05-15: 25 ug/kg/min via INTRAVENOUS

## 2012-05-15 MED ORDER — MORPHINE SULFATE 4 MG/ML IJ SOLN: 0.0500 mg/kg | INTRAMUSCULAR | Status: DC | PRN

## 2012-05-15 MED ORDER — DOCUSATE SODIUM 100 MG PO CAPS
100.0000 mg | ORAL_CAPSULE | Freq: Two times a day (BID) | ORAL | Status: DC
  Administered 2012-05-16 – 2012-05-17 (×3): 100 mg via ORAL
  Filled 2012-05-15 (×5): qty 1

## 2012-05-15 SURGICAL SUPPLY — 61 items
BANDAGE ELASTIC 4 VELCRO ST LF (GAUZE/BANDAGES/DRESSINGS) ×1 IMPLANT
BANDAGE ELASTIC 6 VELCRO ST LF (GAUZE/BANDAGES/DRESSINGS) ×1 IMPLANT
BANDAGE GAUZE ELAST BULKY 4 IN (GAUZE/BANDAGES/DRESSINGS) ×3 IMPLANT
BLADE SURG 10 STRL SS (BLADE) IMPLANT
BLADE SURG ROTATE 9660 (MISCELLANEOUS) ×1 IMPLANT
BRUSH SCRUB DISP (MISCELLANEOUS) ×4 IMPLANT
CLOTH BEACON ORANGE TIMEOUT ST (SAFETY) ×2 IMPLANT
COVER SURGICAL LIGHT HANDLE (MISCELLANEOUS) ×4 IMPLANT
CUFF TOURNIQUET SINGLE 34IN LL (TOURNIQUET CUFF) ×1 IMPLANT
CUFF TOURNIQUET SINGLE 44IN (TOURNIQUET CUFF) IMPLANT
DECANTER SPIKE VIAL GLASS SM (MISCELLANEOUS) IMPLANT
DRAPE C-ARM 42X72 X-RAY (DRAPES) ×2 IMPLANT
DRAPE C-ARMOR (DRAPES) ×2 IMPLANT
DRSG ADAPTIC 3X8 NADH LF (GAUZE/BANDAGES/DRESSINGS) ×1 IMPLANT
DRSG EMULSION OIL 3X3 NADH (GAUZE/BANDAGES/DRESSINGS) ×1 IMPLANT
DRSG PAD ABDOMINAL 8X10 ST (GAUZE/BANDAGES/DRESSINGS) ×1 IMPLANT
ELECT REM PT RETURN 9FT ADLT (ELECTROSURGICAL) ×2
ELECTRODE REM PT RTRN 9FT ADLT (ELECTROSURGICAL) ×1 IMPLANT
GLOVE BIO SURGEON STRL SZ7.5 (GLOVE) ×2 IMPLANT
GLOVE BIO SURGEON STRL SZ8 (GLOVE) ×2 IMPLANT
GLOVE BIOGEL PI IND STRL 7.0 (GLOVE) IMPLANT
GLOVE BIOGEL PI IND STRL 7.5 (GLOVE) ×1 IMPLANT
GLOVE BIOGEL PI IND STRL 8 (GLOVE) ×1 IMPLANT
GLOVE BIOGEL PI INDICATOR 7.0 (GLOVE) ×1
GLOVE BIOGEL PI INDICATOR 7.5 (GLOVE) ×1
GLOVE BIOGEL PI INDICATOR 8 (GLOVE) ×1
GLOVE SURG SS PI 6.5 STRL IVOR (GLOVE) ×1 IMPLANT
GOWN PREVENTION PLUS XLARGE (GOWN DISPOSABLE) ×3 IMPLANT
GOWN PREVENTION PLUS XXLARGE (GOWN DISPOSABLE) ×4 IMPLANT
KIT BASIN OR (CUSTOM PROCEDURE TRAY) ×2 IMPLANT
KIT ROOM TURNOVER OR (KITS) ×2 IMPLANT
MANIFOLD NEPTUNE II (INSTRUMENTS) ×2 IMPLANT
NEEDLE 22X1 1/2 (OR ONLY) (NEEDLE) IMPLANT
NS IRRIG 1000ML POUR BTL (IV SOLUTION) ×2 IMPLANT
PACK ORTHO EXTREMITY (CUSTOM PROCEDURE TRAY) ×2 IMPLANT
PAD ARMBOARD 7.5X6 YLW CONV (MISCELLANEOUS) ×3 IMPLANT
PAD CAST 4YDX4 CTTN HI CHSV (CAST SUPPLIES) ×2 IMPLANT
PADDING CAST COTTON 4X4 STRL (CAST SUPPLIES)
PASSER SUT SWANSON 36MM LOOP (INSTRUMENTS) IMPLANT
RETRIEVER SUT HEWSON (MISCELLANEOUS) ×1 IMPLANT
SPONGE GAUZE 4X4 12PLY (GAUZE/BANDAGES/DRESSINGS) ×1 IMPLANT
SPONGE SCRUB IODOPHOR (GAUZE/BANDAGES/DRESSINGS) ×2 IMPLANT
STAPLER VISISTAT 35W (STAPLE) ×1 IMPLANT
SUCTION FRAZIER TIP 10 FR DISP (SUCTIONS) ×2 IMPLANT
SUT FIBERWIRE #2 38 T-5 BLUE (SUTURE) ×2
SUT FIBERWIRE #5 38 CONV NDL (SUTURE) ×2
SUT STEEL 5 V 56 M (SUTURE) ×1 IMPLANT
SUT STEEL 6MS V (SUTURE) ×1 IMPLANT
SUT VIC AB 0 CT1 27 (SUTURE) ×2
SUT VIC AB 0 CT1 27XBRD ANBCTR (SUTURE) ×1 IMPLANT
SUT VIC AB 2-0 CT1 27 (SUTURE) ×2
SUT VIC AB 2-0 CT1 TAPERPNT 27 (SUTURE) ×2 IMPLANT
SUTURE FIBERWR #2 38 T-5 BLUE (SUTURE) IMPLANT
SUTURE FIBERWR #5 38 CONV NDL (SUTURE) IMPLANT
SYR CONTROL 10ML LL (SYRINGE) IMPLANT
TOWEL OR 17X24 6PK STRL BLUE (TOWEL DISPOSABLE) ×2 IMPLANT
TOWEL OR 17X26 10 PK STRL BLUE (TOWEL DISPOSABLE) ×4 IMPLANT
TUBE CONNECTING 12X1/4 (SUCTIONS) ×2 IMPLANT
UNDERPAD 30X30 INCONTINENT (UNDERPADS AND DIAPERS) ×2 IMPLANT
WATER STERILE IRR 1000ML POUR (IV SOLUTION) ×1 IMPLANT
YANKAUER SUCT BULB TIP NO VENT (SUCTIONS) ×1 IMPLANT

## 2012-05-15 NOTE — Progress Notes (Signed)
Orthopedic Tech Progress Note Patient Details:  [name] [name] October 30, 1945 [id number]  Patient ID: [name] [name], female   DOB: [birth year], 67 y.o.   MRN: [id number] Called advanced @1820  Brace order completed by advanced Nikki Dom 05/15/2012, 9:54 PM

## 2012-05-15 NOTE — Progress Notes (Signed)
FMTS Attending Note  Patient seen and examined by me today, discussed with Dr. Clinton Sawyer and I agree with his management plan.  Patient is recommended for orthopedic surgery for her fractured R patella.  She has had a myoview this morning as part of her cardiac preoperative evaluation.  Pulmonology notes also reviewed, recommending against general anesthesia/intubation in the setting of her lung disease requiring oxygen supplementation.   It is my understanding that patient is for SNF placement at time of discharge. Paula Compton, MD

## 2012-05-15 NOTE — Progress Notes (Signed)
I have seen and examined the patient with Mr. Renae Fickle. I agree with the findings above. I discussed with the patient and her daughter and son-in-law the risks and benefits of surgery, including the possibility of infection, nerve injury, vessel injury, wound breakdown, arthritis, symptomatic hardware, DVT/ PE, loss of motion, and need for further surgery among others.  We also specifically discussed pulmonary failure, death, and the potential outcome of nonsurgical management, which could be acceptable even if she were able to develop a fibrous nonunion.  They understood the possible failure of spinal anesthesia and potential for converting to general or aborting the procedure altogether.  After discussion of these risks, they did wish to proceed.   Budd Palmer, MD 05/15/2012 2:17 PM

## 2012-05-15 NOTE — Progress Notes (Signed)
Subjective: No CP  Breathing is steady.  No complaints. Objective: Filed Vitals:   05/14/12 1900 05/14/12 2335 05/15/12 0624 05/15/12 0747  BP:  134/75 130/81   Pulse:  75 79   Temp:  97.7 F (36.5 C) 98.3 F (36.8 C)   TempSrc:      Resp:  18 18   SpO2: 96% 96% 95% 93%   Weight change:   Intake/Output Summary (Last 24 hours) at 05/15/12 0813 Last data filed at 05/15/12 0731  Gross per 24 hour  Intake    840 ml  Output      0 ml  Net    840 ml    General: Alert, awake, oriented x3, in no acute distress Neck:  JVP is normal Heart: Regular rate and rhythm, without murmurs, rubs, gallops.  Lungs: Clear to auscultation.  No rales or wheezes. Exemities:  No edema.   Neuro: Grossly intact, nonfocal.   Lab Results: Results for orders placed during the hospital encounter of 05/12/12 (from the past 24 hour(s))  BASIC METABOLIC PANEL     Status: Abnormal   Collection Time   05/14/12 11:15 AM      Component Value Range   Sodium 142  135 - 145 (mEq/L)   Potassium 4.1  3.5 - 5.1 (mEq/L)   Chloride 107  96 - 112 (mEq/L)   CO2 26  19 - 32 (mEq/L)   Glucose, Bld 154 (*) 70 - 99 (mg/dL)   BUN 18  6 - 23 (mg/dL)   Creatinine, Ser 6.29  0.50 - 1.10 (mg/dL)   Calcium 9.2  8.4 - 52.8 (mg/dL)   GFR calc non Af Amer 86 (*) >90 (mL/min)   GFR calc Af Amer >90  >90 (mL/min)  CBC     Status: Normal   Collection Time   05/15/12  6:15 AM      Component Value Range   WBC 7.6  4.0 - 10.5 (K/uL)   RBC 3.91  3.87 - 5.11 (MIL/uL)   Hemoglobin 12.6  12.0 - 15.0 (g/dL)   HCT 41.3  24.4 - 01.0 (%)   MCV 98.0  78.0 - 100.0 (fL)   MCH 32.2  26.0 - 34.0 (pg)   MCHC 32.9  30.0 - 36.0 (g/dL)   RDW 27.2  53.6 - 64.4 (%)   Platelets 209  150 - 400 (K/uL)  BASIC METABOLIC PANEL     Status: Abnormal   Collection Time   05/15/12  6:15 AM      Component Value Range   Sodium 143  135 - 145 (mEq/L)   Potassium 3.7  3.5 - 5.1 (mEq/L)   Chloride 106  96 - 112 (mEq/L)   CO2 25  19 - 32 (mEq/L)   Glucose, Bld 101 (*) 70 - 99 (mg/dL)   BUN 17  6 - 23 (mg/dL)   Creatinine, Ser 0.34  0.50 - 1.10 (mg/dL)   Calcium 9.3  8.4 - 74.2 (mg/dL)   GFR calc non Af Amer 87 (*) >90 (mL/min)   GFR calc Af Amer >90  >90 (mL/min)    Studies/Results: No results found.  Medications: I have reviewed the patient's current medications.   Patient Active Hospital Problem List: Patella fracture, right, closed, initial encounter (05/14/2012)   Assessment: being evaluated for surgery. COPD (chronic obstructive pulmonary disease) ()   Assessment: Pulm following.  AAA (abdominal aortic aneurysm) ()   CAD (coronary artery disease) ()   Assessment: To undergo myoview today to risk  stratify as pt not active.  Continue b blocker     LOS: 3 days   Dietrich Pates 05/15/2012, 8:13 AM

## 2012-05-15 NOTE — Brief Op Note (Signed)
05/12/2012 - 05/15/2012  5:23 PM  PATIENT:  [name] [name]  67 y.o. female  PRE-OPERATIVE DIAGNOSIS:  RIGHT PATELLA FRACTURE  POST-OPERATIVE DIAGNOSIS:  RIGHT PATELLA FRACTURE  PROCEDURE:  Procedure(s) (LRB): OPEN REDUCTION INTERNAL (ORIF) FIXATION PATELLA (Right)  SURGEON:  Surgeon(s) and Role:    * Budd Palmer, MD - Primary  PHYSICIAN ASSISTANT: Montez Morita, [facility]   ASSISTANTS: PA Student  ANESTHESIA:   spinal  EBL:  Total I/O In: 840 [P.O.:240; I.V.:600] Out: -   BLOOD ADMINISTERED:none  DRAINS: none   LOCAL MEDICATIONS USED:  NONE  SPECIMEN:  No Specimen  DISPOSITION OF SPECIMEN:  N/A  COUNTS:  YES  TOURNIQUET:  * No tourniquets in log *  DICTATION: .Other Dictation: Dictation Number [id number]  PLAN OF CARE: Admit to inpatient   PATIENT DISPOSITION:  PACU - hemodynamically stable.   Delay start of Pharmacological VTE agent (>24hrs) due to surgical blood loss or risk of bleeding: no

## 2012-05-15 NOTE — Transfer of Care (Signed)
Immediate Anesthesia Transfer of Care Note  Patient: [name] [name]  Procedure(s) Performed: Procedure(s) (LRB): OPEN REDUCTION INTERNAL (ORIF) FIXATION PATELLA (Right)  Patient Location: PACU  Anesthesia Type: MAC and Spinal  Level of Consciousness: awake and patient cooperative  Airway & Oxygen Therapy: Patient Spontanous Breathing and Patient connected to nasal cannula oxygen  Post-op Assessment: Report given to PACU RN, Post -op Vital signs reviewed and stable and Patient moving all extremities X 4  Post vital signs: Reviewed  Complications: No apparent anesthesia complications

## 2012-05-15 NOTE — Progress Notes (Signed)
Daily Progress Note [name] [name]. [name] [name], M.D., M.B.A  Family Medicine PGY-1 Pager [phone]  Subjective: Events yesterday - Cardiology was consulted for perspective on safety for surgery. They suggested a myoview, which will be performed today to evaluated risk. Cardiology team recommended pulmonology consultation since patient has advanced COPD. Pulmonology did not recommend the procedure given her history of difficult extubation. The family was hoping for a possible epidural anesthesia and wanted to speak to the anesthesiology team about that yesterday. However, I do not see a note from anesthesia documenting this conversation.    I walked in this morning and the patient was looking very pale, and I found that her oxygen was completely turned off at the wall. I do not know why or how this happened on an O2 dependent COPD patient. I spoke to the nurse about it, but he was just coming onto the shift and did not have an explanation. A bedside pulse ox was measured 92%.   This morning the patient's daughter is not at bedside. The patient is extremely anxious about the cardiology procedure. I told her that she may not be able to have surgery, and she became tearful.    Objective: Vital signs in last 24 hours: Temp:  [97.6 F (36.4 C)-98.3 F (36.8 C)] 98.3 F (36.8 C) (05/16 0624) Pulse Rate:  [75-95] 79  (05/16 0624) Resp:  [18] 18  (05/16 0624) BP: (124-134)/(73-81) 130/81 mmHg (05/16 0624) SpO2:  [92 %-96 %] 93 % (05/16 0747) Weight change:  Last BM Date: 05/12/12  Intake/Output from previous day: 05/15 0701 - 05/16 0700 In: 600 [P.O.:600] Out: -  Intake/Output this shift: Total I/O In: 240 [P.O.:240] Out: -  General: elderly WF, very anxious, ill appearing, responded to yes-no questions  HEENT: ecchymosis of right brow, EOMI, PERRLA, OP clear and dry  Heart: S1, S2 normal, no murmur, rub or gallop, regular rate and rhythm  Lungs: wheezing, expiratory slowing and decreased  breath sounds  Abdomen: abdomen is soft without significant tenderness, masses, organomegaly or guarding  Extremities: swelling and tenderness right right knee; large ecchymosis on dorsum of right hand, tenderness to palpation of right hand  Skin:scattered ecchymoses and abrasions, worst on right hand Neurology: CN II-XII grossly intact; right sided grip weakness; expressive aphasia    Lab Results:  Basename 05/15/12 0615 05/13/12 0630  WBC 7.6 10.0  HGB 12.6 12.9  HCT 38.3 38.8  PLT 209 199   BMET  Basename 05/15/12 0615 05/14/12 1115  NA 143 142  K 3.7 4.1  CL 106 107  CO2 25 26  GLUCOSE 101* 154*  BUN 17 18  CREATININE 0.69 0.72  CALCIUM 9.3 9.2    Studies/Results:   Medications: I have reviewed the patient's current medications.  Assessment/Plan: [name] [name] is a 67 y.o. year old female presenting with a right patellar fracture after a fall at home. She also has right hand and wrist pain that needs further evaluation. Normally, a person with a isolated patellar fracture would not require an inpatient stay. However, she lives alone and was having difficutly caring for herself prior to this fall. She will need a thorough evaluation by PT and OT for placement considerations.   # Right patellar fracture, transverse - occurred 5/13  - surgery is up in the air given the cardiac myoview and recommendations from pulmonology; I will f/u with Dr. Carola Frost once cardiac procedure is completed and also try to contact anesthesia again  # CAD  - continue home  ASA, statin, metoprolol   # COPD - has increase O2 requirement to 3L, likely 2/2 atelectasis  - continue budesonide BID, and albuterol TID  - continue incentive spirometry today   # Tobacco Abuse - smoke 2 PPD  - nicotine patch 21 mg  # Seizure disorder  - continue home Keppra   # FENGI - regular diet, but NPO after midnight; LR at 50 cc/hr  # PPX - lovenox held in anticipation of surgery today, but will need to be  restarted if surgery not taking place  # Disposition - family already working with LCSW for placement after surgery      LOS: 3 days   [name] [name] 05/15/2012, 9:45 AM

## 2012-05-15 NOTE — Anesthesia Postprocedure Evaluation (Signed)
Anesthesia Post Note  Patient: [name] [name]  Procedure(s) Performed: Procedure(s) (LRB): OPEN REDUCTION INTERNAL (ORIF) FIXATION PATELLA (Right)  Anesthesia type: general  Patient location: PACU  Post pain: Pain level controlled  Post assessment: Patient's Cardiovascular Status Stable  Last Vitals:  Filed Vitals:   05/15/12 1707  BP:   Pulse:   Temp: 36.4 C  Resp:     Post vital signs: Reviewed and stable  Level of consciousness: sedated  Complications: No apparent anesthesia complications

## 2012-05-15 NOTE — Anesthesia Procedure Notes (Addendum)
  Narrative:     SPINAL BLOCK  Pt identified. Timeout performed. Pt sitting. Back prepped. Hands washed and gloved. #25 needle L3-4 paramedian from the right to clear CSF. Marcaine 0.75% 1.5 cc injected incrementally. CSF aspirated after LA injected. Patient positioned. Good block at 5 minutes. Pt tolerated well.   Arta Bruce MD

## 2012-05-15 NOTE — Progress Notes (Signed)
Orthopedic Tech Progress Note Patient Details:  [name] [name] Jun 06, 1945 [id number]  Other Ortho Devices Type of Ortho Device: Knee Immobilizer Ortho Device Location: trapeze bar patient helper Ortho Device Interventions: Application   Braya Habermehl 05/15/2012, 10:15 PM

## 2012-05-15 NOTE — Progress Notes (Signed)
[facility] PCCM  I came by twice today to see Ms. [name] [name], but she was gone for a procedure both times.  After reviewing the notes and discussing the patient's status with her nurse I have concluded that there are no new recommendations to her care that I need to make today.    I have reviewed her previous records and imaging and agree with Dr. Percival Spanish assessment yesterday.  She is very high risk for surgery and it is unlikely that she would do well with mechanical ventilation.  We recommend that treatments should be attempted that do not involve general anesthesia and intubation.  Will see tomorrow or could have partners see her today later if needed.  Yolonda Kida PCCM Pager: [phone] Cell: [phone] If no response, call [phone]

## 2012-05-15 NOTE — Progress Notes (Signed)
Clinical Social Worker (CSW) contacted pt daughter Luciana Axe to follow up and inform her that CSW has faxed pt out and currently [facility] is offering placement. Daughter stated she was appreciative of call and would be interested in [facility] pending the pt possible surgery. CSW will continue to follow.  Theresia Bough, MSW, Theresia Majors [phone]

## 2012-05-15 NOTE — Anesthesia Preprocedure Evaluation (Addendum)
Anesthesia Evaluation  Patient identified by MRN, date of birth, ID band Patient awake and Patient confused    Reviewed: Allergy & Precautions, H&P , NPO status , Patient's Chart, lab work & pertinent test results  Airway Mallampati: III TM Distance: >3 FB Neck ROM: Full  Mouth opening: Limited Mouth Opening  Dental  (+) Missing and Dental Advisory Given   Pulmonary COPD oxygen dependent,          Cardiovascular hypertension, Pt. on medications + CAD     Neuro/Psych Seizures -, Well Controlled,  PSYCHIATRIC DISORDERS TIACVA    GI/Hepatic   Endo/Other    Renal/GU      Musculoskeletal   Abdominal   Peds  Hematology   Anesthesia Other Findings   Reproductive/Obstetrics                          Anesthesia Physical Anesthesia Plan  ASA: III  Anesthesia Plan: Spinal   Post-op Pain Management:    Induction:   Airway Management Planned: Natural Airway  Additional Equipment:   Intra-op Plan:   Post-operative Plan:   Informed Consent: I have reviewed the patients History and Physical, chart, labs and discussed the procedure including the risks, benefits and alternatives for the proposed anesthesia with the patient or authorized representative who has indicated his/her understanding and acceptance.     Plan Discussed with: CRNA and Surgeon  Anesthesia Plan Comments:         Anesthesia Quick Evaluation

## 2012-05-16 ENCOUNTER — Encounter (HOSPITAL_COMMUNITY): Payer: Self-pay | Admitting: General Practice

## 2012-05-16 DIAGNOSIS — R410 Disorientation, unspecified: Secondary | ICD-10-CM | POA: Diagnosis not present

## 2012-05-16 DIAGNOSIS — I779 Disorder of arteries and arterioles, unspecified: Secondary | ICD-10-CM

## 2012-05-16 DIAGNOSIS — R4701 Aphasia: Secondary | ICD-10-CM | POA: Diagnosis present

## 2012-05-16 LAB — CBC
HCT: 37.2 % (ref 36.0–46.0)
Hemoglobin: 12.2 g/dL (ref 12.0–15.0)
MCHC: 32.8 g/dL (ref 30.0–36.0)

## 2012-05-16 LAB — BASIC METABOLIC PANEL
BUN: 13 mg/dL (ref 6–23)
Chloride: 101 mEq/L (ref 96–112)
GFR calc non Af Amer: 87 mL/min — ABNORMAL LOW (ref >90)
Glucose, Bld: 115 mg/dL — ABNORMAL HIGH (ref 70–99)
Potassium: 4 mEq/L (ref 3.5–5.1)

## 2012-05-16 MED ORDER — FUROSEMIDE 10 MG/ML IJ SOLN
INTRAMUSCULAR | Status: AC
  Administered 2012-05-16: 80 mg
  Filled 2012-05-16: qty 8

## 2012-05-16 MED ORDER — FUROSEMIDE 10 MG/ML IJ SOLN: 80.0000 mg | Freq: Once | INTRAMUSCULAR | Status: AC

## 2012-05-16 MED ORDER — ASPIRIN EC 81 MG PO TBEC
81.0000 mg | DELAYED_RELEASE_TABLET | Freq: Every day | ORAL | Status: DC
  Administered 2012-05-16 – 2012-05-17 (×2): 81 mg via ORAL
  Filled 2012-05-16 (×2): qty 1

## 2012-05-16 MED ORDER — METHOCARBAMOL 500 MG PO TABS: 500.0000 mg | ORAL_TABLET | Freq: Four times a day (QID) | ORAL | Status: AC | PRN

## 2012-05-16 MED ORDER — OXYCODONE HCL 5 MG PO TABS
2.5000 mg | ORAL_TABLET | ORAL | Status: DC | PRN
  Administered 2012-05-16 – 2012-05-17 (×3): 5 mg via ORAL
  Filled 2012-05-16 (×4): qty 1

## 2012-05-16 MED ORDER — ACETAMINOPHEN 10 MG/ML IV SOLN
1000.0000 mg | Freq: Four times a day (QID) | INTRAVENOUS | Status: AC
  Administered 2012-05-16 – 2012-05-17 (×4): 1000 mg via INTRAVENOUS
  Filled 2012-05-16 (×4): qty 100

## 2012-05-16 NOTE — Clinical Documentation Improvement (Signed)
CHANGE MENTAL STATUS DOCUMENTATION CLARIFICATION   THIS DOCUMENT IS NOT A PERMANENT PART OF THE MEDICAL RECORD  TO RESPOND TO THE THIS QUERY, FOLLOW THE INSTRUCTIONS BELOW:  1. If needed, update documentation for the patient's encounter via the notes activity.  2. Access this query again and click edit on the In [employer].  3. After updating, or not, click F2 to complete all highlighted (required) fields concerning your review. Select "additional documentation in the medical record" OR "no additional documentation provided".  4. Click Sign note button.  5. The deficiency will fall out of your In Basket *Please let us know if you are not able to complete this workflow by phone or e-mail (listed below).         05/16/12  Dear Dr.E.  Laurin Coder Marton Redwood  In an effort to better capture your patient's severity of illness, reflect appropriate length of stay and utilization of resources, a review of the patient medical record has revealed the following indicators.    Based on your clinical judgment, please clarify and document in a progress note and/or discharge summary the clinical condition associated with the following supporting information:  In responding to this query please exercise your independent judgment.  The fact that a query is asked, does not imply that any particular answer is desired or expected.  Per notes patient had "delirium", "agitation" and possible "visual hallucinations" during hospital stay, due to medications. Please clarify secondary diagnosis if known for greater specificity.  Thank you    Possible Clinical Conditions?   _______Drug induced confusion/delirium  _______Acute confusion  _______Acute delirium  _______Acute exacerbation of known dementia (indicate type)  _______Other Condition__________________  _______Cannot Clinically Determine    Supporting Information: "Last night she was noted to have some delirium and agitation. This improved  with a sitter, and she did not require any haldol. This morning she is very pleasant, but showing signs of delirium"  Risk Factors: s/p ORIF rt patella fracture  Signs & Symptoms: "AMS - Onset 5/16 post-operative; This is likely a combination of medication induced and sleep deprivation"   Treatment: sitter , haldol prn   Reviewed: additional documentation in the medical record  Thank You,  Leonette Most Addison  Clinical Documentation Specialist RN, BSN:  Pager [phone] HIM off [phone] (M-F, 8a-430p)   Health Information Management [place]

## 2012-05-16 NOTE — Progress Notes (Signed)
Subjective: 1 Day Post-Op Procedure(s) (LRB): OPEN REDUCTION INTERNAL (ORIF) FIXATION PATELLA (Right)   [name] [name] was seen and examined this AM.  Pt complaining of right leg pain, that was made worse when she tried to move to urinate. Per pt she had just received her PO pain meds.  Pain with better control over night, but had recently increased.  Pt denies numbness, tingling in lower extremity.  Endorses ability to move distal extremities.  Denies nausea, vomiting.  Endorses eating minimal breakfast.  Objective: Vital signs in last 24 hours: Temp:  [97.1 F (36.2 C)-99.2 F (37.3 C)] 97.1 F (36.2 C) (05/17 0550) Pulse Rate:  [67-100] 67  (05/17 0550) Resp:  [16-18] 18  (05/17 0550) BP: (127-173)/(44-90) 132/48 mmHg (05/17 0550) SpO2:  [94 %-100 %] 97 % (05/17 0550) FiO2 (%):  [28 %] 28 % (05/16 2019) Weight:  [62.506 kg (137 lb 12.8 oz)] 62.506 kg (137 lb 12.8 oz) (05/16 1707)  Intake/Output from previous day: 05/16 0701 - 05/17 0700 In: 2242.5 [P.O.:480; I.V.:1762.5] Out: -  Intake/Output this shift:   Intake/Output      05/16 0701 - 05/17 0700 05/17 0701 - 05/18 0700   P.O. 480 240   I.V. (mL/kg) 1762.5 (28.2)    Total Intake(mL/kg) 2242.5 (35.9) 240 (3.8)   Net +2242.5 +240        Urine Occurrence 8 x       Basename 05/16/12 0612 05/15/12 0615  HGB 12.2 12.6    Basename 05/16/12 0612 05/15/12 0615  WBC 8.5 7.6  RBC 3.85* 3.91  HCT 37.2 38.3  PLT 215 209    Basename 05/16/12 0612 05/15/12 0615  NA 138 143  K 4.0 3.7  CL 101 106  CO2 29 25  BUN 13 17  CREATININE 0.71 0.69  GLUCOSE 115* 101*  CALCIUM 9.2 9.3   No results found for this basename: LABPT:2,INR:2 in the last 72 hours   AFVSS Gen: Pt laying in bed, grimmacing, moving around.  Awake, alert, oriented.   Resp: CTAB although diminished. Cardio: RRR, unable to appreciate any murmur Abd: + BS all four quadrants Lower extremites:  Able to move feet bilaterally, elevate left thigh, leg.  Equal  sensation throughout bilat lower extremities. Able to extend great toes bilaterally.  Strength diminished on right side with extension/flexion of foot. Cap refill slightly decreased bilat. Knee immobilizer right leg with dressing CDI. SCD in place bilaterally.  Assessment/Plan: 1 Day Post-Op Procedure(s) (LRB): OPEN REDUCTION INTERNAL (ORIF) FIXATION PATELLA (Right)   67 year old female with significant COPD post-op day #1 s/p ORIF right patella 2/2 mechanical fall.  Pt doing well this morning, pain appropriate.  1:Pain management: Continue with PO pain medications prn. 2:Ambulation: Knee to remain in full extension with knee immobilizer in place.  Weight bearing as tolerated. 3: Resp: Significant COPD likely reason for diminished lung sounds. Pt reminded of negative impacts of smoking and wound healing. 4: Cardio: Cap refill consistent with peripheral vasoconstriction 2/2 significant smoking history, an expected finding. 5: Prophylaxis: SCD in place. IS to be used several times throughout day.  Lovenox [place] daily. OOB as tolerated.  Tora Duck, PA-S 16 May 2012, 08:54   Pt seen and examined with PA-S. Agree with the findings above  S: doing well, pain R knee. Hinged brace already applied      O:  Gen: awake and alert, NAD  Lungs: decreased sounds  Cardiac: s1 and s2  Abd: + BS  R LEx: brace  fitting well   Dressing c/d/i   Distal motor and sensory functions intact   Swelling stable   Ext is warm with + DP pulse  A: 67 y/o female s/p ground level fall  P:  1. R intra-articular patella fx pod 1  Doing well  WBAT R leg with leg in full extension and brace onl  NO ROM of R knee at this time  Will not be permitted to perform active R knee extension for about 6 weeks  Will initiate passive knee ROM in 2 weeks  Ice and elevate extremity  Dressing changes on Sunday 2. Pain  Changed pain meds to following  Oxy ir 2.5-5 mg po q 4 hours prn pain  Scheduled IV tylenol for 24-48  hours 3. DVT/PE prophylaxis  Lovenox 40 mg sq x 14 days post op 4. Activity   Encourage mobilization  OOB to chair with assist  Can try to ambulate as long as above precautions are followed 5. Nicotine dependence  Please avoid all nicotine products if at all possible, including patches due to negative effects on bone and soft tissue healing.  6. Continue per medicine service 7. dispo  Ortho issues stable, can go to SNF when bed available  F/u with ortho in 10 days  Wound care and instructions noted in d/c paperwork  Mearl Latin, PA-C [facility] [phone] (P) 05/16/2012, 8:07 AM

## 2012-05-16 NOTE — Progress Notes (Signed)
CSW informed by [facility] that they needed to receive pt dc summary by 16:00 today. CSW provided [facility] with pt dc summary and AVS via [facility]. CSW made aware by family & RN that pt nicotine patch was dc'ed and pt unable to have any other remedies that include nicotine. CSW spoke with pt son Rosanne Ashing and informed him that the only other facilities that allow smoking outside are [facility] and [facility] which are both located in [place]. Rosanne Ashing stated that family aware of challenges pt may face and prefer for pt to go to [facility]. Rosanne Ashing stated family will facilitate as much as possible to ease pt anxiety. CSW spoke to RN and inquired whether pt can take Chantix. CSW informed that RN will speak to MD and explore other options for pt. Including Chantix.   Facility, MD, RN and family aware that pt will dc tomorrow to [facility] at 13:30 pending pt behavior today/tonight.   Weekend CSW to facility with dc to [facility] 05/17/12 at 13:30  [name] [name], MSW, [name] [name] [phone]

## 2012-05-16 NOTE — Discharge Summary (Signed)
[facility] Attending Note Patient seen and examined by me, discussed with resident team.  Medically appropriate for discharge to SNF when bed ready. Paula Compton, MD

## 2012-05-16 NOTE — Discharge Instructions (Signed)
[facility]th[facility] Trauma Service Discharge Instructions, Pin Site and Wound Care   General Discharge Instructions  WEIGHT BEARING STATUS: Weight Bear as tolerated on R leg with knee in FULL EXTENSION AND brace on  RANGE OF MOTION/ACTIVITY: NO ROM of R knee at this time  ** Brace on at all times except for dressing changes and hygiene   May begin daily DRY dressing changes on 05/18/2012.  NO ointments,lotions or solutions to operative site  ** Please avoid nicotine products if at all possible.  Severely impedes bone and soft tissue healing  Diet: as you were eating previously.  Can use over the counter stool softeners and bowel preparations, such as Miralax, to help with bowel movements.  Narcotics can be constipating.  Be sure to drink plenty of fluids  STOP SMOKING OR USING NICOTINE PRODUCTS!!!!  As discussed nicotine severely impairs your body's ability to heal surgical and traumatic wounds but also impairs bone healing.  Wounds and bone heal by forming microscopic blood vessels (angiogenesis) and nicotine is a vasoconstrictor (essentially, shrinks blood vessels).  Therefore, if vasoconstriction occurs to these microscopic blood vessels they essentially disappear and are unable to deliver necessary nutrients to the healing tissue.  This is one modifiable factor that you can do to dramatically increase your chances of healing your injury.    (This means no smoking, no nicotine gum, patches, etc)  DO NOT USE NONSTEROIDAL ANTI-INFLAMMATORY DRUGS (NSAID'S)  Using products such as Advil (ibuprofen), Aleve (naproxen), Motrin (ibuprofen) for additional pain control during fracture healing can delay and/or prevent the healing response.  If you would like to take over the counter (OTC) medication, Tylenol (acetaminophen) is ok.  However, some narcotic medications that are given for pain control contain acetaminophen as well. Therefore, you should not exceed more than 4000 mg of tylenol in a day if you do not  have liver disease.  Also note that there are may OTC medicines, such as cold medicines and allergy medicines that my contain tylenol as well.  If you have any questions about medications and/or interactions please ask your doctor/PA or your pharmacist.   PAIN MEDICATION USE AND EXPECTATIONS  You have likely been given narcotic medications to help control your pain.  After a traumatic event that results in an fracture (broken bone) with or without surgery, it is ok to use narcotic pain medications to help control one's pain.  We understand that everyone responds to pain differently and each individual patient will be evaluated on a regular basis for the continued need for narcotic medications. Ideally, narcotic medication use should last no more than 6-8 weeks (coinciding with fracture healing).   As a patient it is your responsibility as well to monitor narcotic medication use and report the amount and frequency you use these medications when you come to your office visit.   We would also advise that if you are using narcotic medications, you should take a dose prior to therapy to maximize you participation.  IF YOU ARE ON NARCOTIC MEDICATIONS IT IS NOT PERMISSIBLE TO OPERATE A MOTOR VEHICLE (MOTORCYCLE/CAR/TRUCK/MOPED) OR HEAVY MACHINERY DO NOT MIX NARCOTICS WITH OTHER CNS (CENTRAL NERVOUS SYSTEM) DEPRESSANTS SUCH AS ALCOHOL       ICE AND ELEVATE INJURED/OPERATIVE EXTREMITY  Using ice and elevating the injured extremity above your heart can help with swelling and pain control.  Icing in a pulsatile fashion, such as 20 minutes on and 20 minutes off, can be followed.    Do not place ice directly on  skin. Make sure there is a barrier between to skin and the ice pack.    Using frozen items such as frozen peas works well as the conform nicely to the are that needs to be iced.  USE AN ACE WRAP OR TED HOSE FOR SWELLING CONTROL  In addition to icing and elevation, Ace wraps or TED hose are used to help  limit and resolve swelling.  It is recommended to use Ace wraps or TED hose until you are informed to stop.    When using Ace Wraps start the wrapping distally (farthest away from the body) and wrap proximally (closer to the body)   Example: If you had surgery on your leg or thing and you do not have a splint on, start the ace wrap at the toes and work your way up to the thigh        If you had surgery on your upper extremity and do not have a splint on, start the ace wrap at your fingers and work your way up to the upper arm  IF YOU ARE IN A SPLINT OR CAST DO NOT REMOVE IT FOR ANY REASON   If your splint gets wet for any reason please contact the office immediately. You may shower in your splint or cast as long as you keep it dry.  This can be done by wrapping in a cast cover or garbage back (or similar)  Do Not stick any thing down your splint or cast such as pencils, money, or hangers to try and scratch yourself with.  If you feel itchy take benadryl as prescribed on the bottle for itching  CALL THE OFFICE WITH ANY QUESTIONS OR CONCERTS: [phone]     Discharge Pin Site Instructions  Dress pins daily with Kerlix roll starting on POD 2. Wrap the Kerlix so that it tamps the skin down around the pin-skin interface to prevent/limit motion of the skin relative to the pin.  (Pin-skin motion is the primary cause of pain and infection related to external fixator pin sites).  Remove any crust or coagulum that may obstruct drainage with a saline moistened gauze or soap and water.  After POD 3, if there is no discernable drainage on the pin site dressing, the interval for change can by increased to every other day.  You may shower with the fixator, cleaning all pin sites gently with soap and water.  If you have a surgical wound this needs to be completely dry and without drainage before showering.  The extremity can be lifted by the fixator to facilitate wound care and transfers.  Notify the  office/Doctor if you experience increasing drainage, redness, or pain from a pin site, or if you notice purulent (thick, snot-like) drainage.  Discharge Wound Care Instructions  Do NOT apply any ointments, solutions or lotions to pin sites or surgical wounds.  These prevent needed drainage and even though solutions like hydrogen peroxide kill bacteria, they also damage cells lining the pin sites that help fight infection.  Applying lotions or ointments can keep the wounds moist and can cause them to breakdown and open up as well. This can increase the risk for infection. When in doubt call the office.  Surgical incisions should be dressed daily.  If any drainage is noted, use one layer of adaptic, then gauze, Kerlix, and an ace wrap.  Once the incision is completely dry and without drainage, it may be left open to air out.  Showering may begin 36-48 hours  later.  Cleaning gently with soap and water.  Traumatic wounds should be dressed daily as well.    One layer of adaptic, gauze, Kerlix, then ace wrap.  The adaptic can be discontinued once the draining has ceased    If you have a wet to dry dressing: wet the gauze with saline the squeeze as much saline out so the gauze is moist (not soaking wet), place moistened gauze over wound, then place a dry gauze over the moist one, followed by Kerlix wrap, then ace wrap.

## 2012-05-16 NOTE — Progress Notes (Signed)
Subjective:  No SOB or CP.  Note confusion documented. Objective: Filed Vitals:   05/15/12 2152 05/15/12 2250 05/15/12 2349 05/16/12 0550  BP: 170/61 173/52 168/58 132/48  Pulse: 81 82 80 67  Temp: 98.3 F (36.8 C) 97.8 F (36.6 C) 97.7 F (36.5 C) 97.1 F (36.2 C)  TempSrc:      Resp: 18 18 18 18   Height:      Weight:      SpO2: 97% 95% 96% 97%   Weight change:   Intake/Output Summary (Last 24 hours) at 05/16/12 1037 Last data filed at 05/16/12 0841  Gross per 24 hour  Intake 2242.5 ml  Output      0 ml  Net 2242.5 ml    General: Alert, awake, oriented x3, in no acute distress Neck:  Mild increase JVP Heart: Regular rate and rhythm, without murmurs, rubs, gallops.  Lungs: Clear to auscultation.Crackles at R base. Exemities:  No edema.   Neuro: Grossly intact, nonfocal.   Lab Results: Results for orders placed during the hospital encounter of 05/12/12 (from the past 24 hour(s))  SURGICAL PCR SCREEN     Status: Normal   Collection Time   05/15/12  2:37 PM      Component Value Range   MRSA, PCR NEGATIVE  NEGATIVE    Staphylococcus aureus NEGATIVE  NEGATIVE   CBC     Status: Abnormal   Collection Time   05/16/12  6:12 AM      Component Value Range   WBC 8.5  4.0 - 10.5 (K/uL)   RBC 3.85 (*) 3.87 - 5.11 (MIL/uL)   Hemoglobin 12.2  12.0 - 15.0 (g/dL)   HCT 16.1  09.6 - 04.5 (%)   MCV 96.6  78.0 - 100.0 (fL)   MCH 31.7  26.0 - 34.0 (pg)   MCHC 32.8  30.0 - 36.0 (g/dL)   RDW 40.9  81.1 - 91.4 (%)   Platelets 215  150 - 400 (K/uL)  BASIC METABOLIC PANEL     Status: Abnormal   Collection Time   05/16/12  6:12 AM      Component Value Range   Sodium 138  135 - 145 (mEq/L)   Potassium 4.0  3.5 - 5.1 (mEq/L)   Chloride 101  96 - 112 (mEq/L)   CO2 29  19 - 32 (mEq/L)   Glucose, Bld 115 (*) 70 - 99 (mg/dL)   BUN 13  6 - 23 (mg/dL)   Creatinine, Ser 7.82  0.50 - 1.10 (mg/dL)   Calcium 9.2  8.4 - 95.6 (mg/dL)   GFR calc non Af Amer 87 (*) >90 (mL/min)   GFR calc Af  Amer >90  >90 (mL/min)    Studies/Results: Nm Myocar Multi W/spect W/wall Motion / Ef  05/15/2012  *RADIOLOGY REPORT*  Clinical Data:  Preop evaluation.  67 year old female with coronary artery disease.  MYOCARDIAL IMAGING WITH SPECT (REST AND PHARMACOLOGIC-STRESS) GATED LEFT VENTRICULAR WALL MOTION STUDY LEFT VENTRICULAR EJECTION FRACTION  Technique:  Resting myocardial SPECT imaging was initially performed after intravenous administration of radiopharmaceutical. Myocardial SPECT was subsequently performed after additional radiopharmaceutical injection during pharmacologic-stress supervised by the Cardiology staff.  Quantitative gated imaging was also performed to evaluate left ventricular wall motion, and estimate left ventricular ejection fraction.  Radiopharmaceutical:  Tc-79m Myoview at rest and during stress.  Comparison: none  Findings:  Technique: Study is adequate.  Perfusion:  There are no relative decreased counts on stress or rest to suggest reversible ischemia  or infarction. A small defect within the anterior septal wall is fix.  This may represent a right ventricular insertion.  Wall motion:  No focal wall motion abnormality.  Normal contractility.  Left ventricular ejection fraction:  Calculated left ventricular ejection fraction =  greater than 70%  IMPRESSION:  1.  No reversible ischemia or infarction. 2.  Normal wall motion. 3.  Left ventricular ejection fraction equal greater than 70%  Original Report Authenticated By: Genevive Bi, M.D.   Dg Knee Right Port  05/16/2012  *RADIOLOGY REPORT*  Clinical Data: Right patellar fracture.  Postoperative for repair and excision of articular fragment.  PORTABLE RIGHT KNEE - 1-2 VIEW  Comparison: 05/12/2012  Findings: Skin staples noted.  Operative reduction and internal fixation of the patella noted, with apparent removal of the lower pole fragment.  Postoperative gas in the suprapatellar bursa and subcutaneous tissues noted.  No new  fracture or complicating feature observed.  IMPRESSION: 1.  Interval operative reduction and internal fixation of patellar fracture.  Original Report Authenticated By: Dellia Cloud, M.D.    Medications:  Prior to Admission:  Prescriptions prior to admission  Medication Sig Dispense Refill  . albuterol (ACCUNEB) 1.25 MG/3ML nebulizer solution USE 1 VIAL VIA NEBULIZER EVERY 4 HOURS AS NEEDED FOR WHEEZING OR SHORTNESS OF BREATH  300 mL  0  . albuterol (PROVENTIL HFA;VENTOLIN HFA) 108 (90 BASE) MCG/ACT inhaler Inhale 2 puffs into the lungs every 4 (four) hours as needed. For shortness of breath      . albuterol (PROVENTIL) (2.5 MG/3ML) 0.083% nebulizer solution Take 3 mLs (2.5 mg total) by nebulization 3 (three) times daily.  75 mL  12  . aspirin 81 MG EC tablet Take 81 mg by mouth daily.        Marland Kitchen atorvastatin (LIPITOR) 80 MG tablet TAKE  1/2 TABLET BY MOUTH DAILY  15 tablet  3  . budesonide (PULMICORT) 0.25 MG/2ML nebulizer solution Take 2 mLs (0.25 mg total) by nebulization 2 (two) times daily.  120 mL  3  . Calcium Citrate-Vitamin D 500-400 MG-UNIT CHEW Chew 1 tablet by mouth 2 (two) times daily.        Marland Kitchen levETIRAcetam (KEPPRA) 500 MG tablet Take 500 mg by mouth 2 (two) times daily.        . metoprolol tartrate (LOPRESSOR) 25 MG tablet Take 25 mg by mouth daily.        . multivitamin-iron-minerals-folic acid (CENTRUM) chewable tablet Chew 1 tablet by mouth daily.           Patient Active Hospital Problem List: COPD (chronic obstructive pulmonary disease) ()   Assessment: Pulm following    CAD (coronary artery disease) ()   Assessment: Normal myoview.  Patient POD 1  Hemodynamics stable. I would bring IV to saline lock  Taking PO  Exam with rales at r base (poor effort) Will check BNP  May benefit from 1 dose lasix.  HTN:  Better control today.  Follow   LOS: 4 days   Dietrich Pates 05/16/2012, 10:37 AM

## 2012-05-16 NOTE — Evaluation (Signed)
Physical Therapy Evaluation Patient Details Name: [name] [name] MRN: [id number] DOB: 1945-05-18 Today's Date: 05/16/2012 Time: 1000-1025 PT Time Calculation (min): 25 min  PT Assessment / Plan / Recommendation Clinical Impression  pt presents post fall with R patella fx.  pt following all directions well and attempts to A as much as she can for mobility.  pt has had a reported history of falls at home and plans to D/C to SNF.  During supine to sit Nystagmus noted though difficult to assess as pt needing physical A to maintain initial sitting and pt squinting eyes closed often despite cues.  Will need further Vestibular Assessment once pt better able to tolerate mobility.      PT Assessment  Patient needs continued PT services    Follow Up Recommendations  Skilled nursing facility    Barriers to Discharge None      lEquipment Recommendations  Defer to next venue    Recommendations for Other Services     Frequency Min 3X/week    Precautions / Restrictions Precautions Precautions: Fall Precaution Comments: Brace in knee extension Required Braces or Orthoses:  (Bledsoe locked in extension R LE.  ) Restrictions Weight Bearing Restrictions: Yes RLE Weight Bearing: Weight bearing as tolerated   Pertinent Vitals/Pain       Mobility  Bed Mobility Bed Mobility: Rolling Right;Supine to Sit;Sitting - Scoot to [place] of Bed Rolling Right: 4: Min assist;With rail Supine to Sit: 1: +2 Total assist Supine to Sit: Patient Percentage: 50% Sitting - Scoot to Edge of Bed: 1: +2 Total assist Sitting - Scoot to Edge of Bed: Patient Percentage: 40% Details for Bed Mobility Assistance: cues for safe technique, encouragement, sequencing.   Transfers Transfers: Sit to Stand;Stand to Sit;Stand Pivot Transfers Sit to Stand: 1: +2 Total assist;With upper extremity assist;From bed;From chair/3-in-1;With armrests Sit to Stand: Patient Percentage: 40% Stand to Sit: 1: +2 Total assist;With upper  extremity assist;With armrests;To chair/3-in-1 Stand to Sit: Patient Percentage: 40% Stand Pivot Transfers: 1: +2 Total assist Stand Pivot Transfers: Patient Percentage: 40% Details for Transfer Assistance: pt needs increased time and then is able to better A with transfers.  cues for sequencing and technique.  pt transfered to 3-in-1 and needed total A for hygiene then chair was brought behind pt.   Ambulation/Gait Ambulation/Gait Assistance: Not tested (comment) Stairs: No Wheelchair Mobility Wheelchair Mobility: No    Exercises     PT Diagnosis: Acute pain  PT Problem List: Decreased strength;Decreased activity tolerance;Decreased balance;Decreased mobility;Decreased knowledge of use of DME;Pain;Decreased knowledge of precautions PT Treatment Interventions: DME instruction;Gait training;Functional mobility training;Therapeutic activities;Therapeutic exercise;Balance training;Patient/family education   PT Goals Acute Rehab PT Goals PT Goal Formulation: With patient Time For Goal Achievement: 05/30/12 Potential to Achieve Goals: Good Pt will Roll Supine to Right Side: with supervision PT Goal: Rolling Supine to Right Side - Progress: Goal set today Pt will Roll Supine to Left Side: with supervision PT Goal: Rolling Supine to Left Side - Progress: Goal set today Pt will go Supine/Side to Sit: with supervision PT Goal: Supine/Side to Sit - Progress: Goal set today Pt will go Sit to Supine/Side: with supervision PT Goal: Sit to Supine/Side - Progress: Goal set today Pt will go Sit to Stand: with min assist PT Goal: Sit to Stand - Progress: Goal set today Pt will go Stand to Sit: with min assist PT Goal: Stand to Sit - Progress: Goal set today Pt will Ambulate: with min assist;with rolling walker;51 - 150 feet PT  Goal: Ambulate - Progress: Goal set today  Visit Information  Last PT Received On: 05/16/12 Assistance Needed: +2    Subjective Data  Subjective: I really want to go  to the bathroom.   Patient Stated Goal: Walk normal again   Prior Functioning  Home Living Lives With: Alone Available Help at Discharge: Skilled Nursing Facility Additional Comments: pt has had some falls at home and plans for SNF at D/C.   Prior Function Level of Independence: Independent Vocation: Retired Comments: ? how well pt had been doing at home.   Communication Communication:  ([place], difficult to understand)    Cognition  Overall Cognitive Status: Difficult to assess Difficult to assess due to:  (Difficult to understand pt.  ) Arousal/Alertness: Awake/alert Orientation Level: Appears intact for tasks assessed Behavior During Session: [facility] for tasks performed Cognition - Other Comments: pt follows all one step directions, but occasionally seems to look off in space.      Extremity/Trunk Assessment Right Lower Extremity Assessment RLE ROM/Strength/Tone: Deficits RLE ROM/Strength/Tone Deficits: R Patellar fx in bledsoe brace locked in extension.   RLE Sensation: WFL - Light Touch Left Lower Extremity Assessment LLE ROM/Strength/Tone: WFL for tasks assessed LLE Sensation: WFL - Light Touch   Balance Balance Balance Assessed: No  End of Session PT - End of Session Equipment Utilized During Treatment: Gait belt (L Bledsoe brace) Activity Tolerance: Patient tolerated treatment well Patient left: in chair;with call bell/phone within reach;with family/visitor present (Dtr) Nurse Communication: Mobility status   RitenourAlison Murray, [place] [phone] 05/16/2012, 11:33 AM

## 2012-05-16 NOTE — Discharge Summary (Signed)
Physician Discharge Summary  Patient ID: [name] [name] MRN: [id number] DOB/AGE: 1945/05/26 67 y.o.  Admit date: 05/12/2012 Discharge date: 05/16/2012  Admission Diagnoses: Right patella fracture COPD CAD Seizure Disorder Aphasia   Discharge Diagnoses:  Principal Problem:  *Patella fracture, right, closed, initial encounter Active Problems:  Tobacco use disorder  SEIZURE DISORDER, COMPLEX PARTIAL  COPD (chronic obstructive pulmonary disease)  AAA (abdominal aortic aneurysm)  CAD (coronary artery disease)  Hypoxemia  Wheezes  Expressive aphasia   Discharged Condition: stable  Hospital Course:   [name] [name] is a 67 y.o. year old female presenting with a right patellar fracture after a fall at home. This was successfully repaired by Dr. Carola Frost on 05/15/12.  Patient will be discharged to SNF for short-term rehab.    # Right patellar fracture, transverse - occurred 5/13 status post successful surgery on 5/16; patient to call Dr. Magdalene Patricia office to schedule follow in about 10 days.    # AMS - Onset 5/16 post-operative likely a combination of medication induced and sleep deprivation.  She was not agitated or altered at time of discharge.  She did not require any pharmacological interventions.  # CAD - stable during hospital course.  Appreciate Cardiology consult.  Her home medications were continued (ASA, statin, metoprolol).  Results of nuclear stress test were normal.  See results below.  Pro-BNP on day of discharge was elevated 1016.  One dose of Lasix 80 mg IV daily given prior to discharge.  # COPD - has increase O2 requirement to 3L, likely 2/2 atelectasis.  Patient to continue budesonide BID, and albuterol TID.  Patient was able to use incentive spirometer post-operatively.    # Tobacco Abuse - she continues to smoke 2 PPD.  Advised against tobacco or nicotine replacements post-op to promote soft tissue and bone health.  # Seizure disorder - stable on home dose of  Keppra.   Consults: orthopedic surgery, pulmonology, cardiology, physical therapy, occupational therapy   Significant Diagnostic Studies:   Right Knee X-ray 5/16: Interval operative reduction and internal fixation of patellar fracture.   Cardiac Myoview 5/16 1. No reversible ischemia or infarction.  2. Normal wall motion.  3. Left ventricular ejection fraction equal greater than 70%   Discharge Exam: Blood pressure 132/48, pulse 67, temperature 97.1 F (36.2 C), temperature source Oral, resp. rate 18, height 5' (1.524 m), weight 137 lb 12.8 oz (62.506 kg), SpO2 97.00%.  General: elderly WF, pleasant, non-distressed, responded to yes-no questions  HEENT: ecchymosis of right brow, EOMI, PERRLA, OP clear and dry  Heart: S1, S2 normal, no murmur, rub or gallop, regular rate and rhythm  Lungs: wheezing, expiratory slowing and decreased breath sounds  Abdomen: abdomen is soft without significant tenderness, masses, organomegaly or guarding  Extremities: swelling and tenderness right right knee; large ecchymosis on dorsum of right hand, tenderness to palpation of right hand  Skin:scattered ecchymoses and abrasions, worst on right hand  Neurology: CN II-XII grossly intact; right sided grip weakness; expressive aphasia  Psych: may be responding to external stimuli, but very difficult to tell with her aphasia; not agitated    Disposition: Discharge to [facility] with Follow up Dr. Carola Frost in 10 days and PCP on 06/09/12   Medication List  As of 05/16/2012  3:19 PM   ASK your doctor about these medications         albuterol 108 (90 BASE) MCG/ACT inhaler   Commonly known as: PROVENTIL HFA;VENTOLIN HFA   Inhale 2 puffs into  the lungs every 4 (four) hours as needed. For shortness of breath      albuterol 1.25 MG/3ML nebulizer solution   Commonly known as: ACCUNEB   USE 1 VIAL VIA NEBULIZER EVERY 4 HOURS AS NEEDED FOR WHEEZING OR SHORTNESS OF BREATH      albuterol  (2.5 MG/3ML) 0.083% nebulizer solution   Commonly known as: PROVENTIL   Take 3 mLs (2.5 mg total) by nebulization 3 (three) times daily.      aspirin 81 MG EC tablet   Take 81 mg by mouth daily.      atorvastatin 80 MG tablet   Commonly known as: LIPITOR   TAKE  1/2 TABLET BY MOUTH DAILY      budesonide 0.25 MG/2ML nebulizer solution   Commonly known as: PULMICORT   Take 2 mLs (0.25 mg total) by nebulization 2 (two) times daily.      Calcium Citrate-Vitamin D 500-400 MG-UNIT Chew   Chew 1 tablet by mouth 2 (two) times daily.      levETIRAcetam 500 MG tablet   Commonly known as: KEPPRA   Take 500 mg by mouth 2 (two) times daily.      metoprolol tartrate 25 MG tablet   Commonly known as: LOPRESSOR   Take 25 mg by mouth daily.      multivitamin-iron-minerals-folic acid chewable tablet   Chew 1 tablet by mouth daily.           Follow-up Information    Follow up with HANDY,MICHAEL H, MD in 10 days. (call for appointment)    Contact information:   [address] [place] [address] [phone]        Future Appointments: Provider: Department: Dept Phone: Center:   06/09/2012 3:45 PM Shelly Rubenstein, MD [facility] Res[facility] [phone] [facility]        Signed: [name] 05/16/2012, 11:45 AM  Reviewed and co-signed: DE LA CRUZ,Elmarie Devlin 05/17/2011, 15:15 PM

## 2012-05-16 NOTE — Progress Notes (Signed)
Clinical Child psychotherapist (CSW) confirmed with facility that if pt remains stable, and compliant that they will accept pt tomorrow. Family aware of dc pending pt behavior today/over night. CSW confirmed with Janie at [facility] that family okay to sign paperwork today between 2-3p today. Family agreeable and will sign paperwork today. CSW will provide information to weekend CSW who will facilitate with dc tomorrow. CSW will also inform MD that dc summary need be completed by tomorrow am.   Theresia Bough, MSW, [facility] [phone]

## 2012-05-16 NOTE — Progress Notes (Addendum)
PCP note I visited this patient today and also met with her daughter Luciana Axe and her son Chanetta Marshall who lives in [place]. Patient appears to be doing well following ORIF of her right patella, which she injured following a fall.   I am glad to hear that patient will be going to short-term rehabilitation at [facility] following hospital discharge. She is still not amenable to long-term care, which she needs. However, since she has at least agree to go to SNF, we will re-address long-term care at a later time.   I will cancel her appointments at the [facility] and with me. I have asked [name] to re-schedule if patient is discharged from SNF.   FMTS Attending Note  Patient seen and examined by me; she reports good pain control.  Is eating lunch with her daughter in the room.  Per daughter, patient appears to be mentating normally, does not appear to be delirious at this time.  Worse for her at night (this has happened in previous hospitalizations as well).  Plan for discharge to [facility] for rehab, likely tomorrow.  Paula Compton, MD

## 2012-05-16 NOTE — Evaluation (Signed)
Occupational Therapy Evaluation Patient Details Name: [name] [name] MRN: [id number] DOB: 1945-04-02 Today's Date: 05/16/2012 Time: 1478-2956 OT Time Calculation (min): 17 min  OT Assessment / Plan / Recommendation Clinical Impression  67 yo female s/p Rt LE patella repair that could benefit from skilled OT acutely. REcommend SNF    OT Assessment  Patient needs continued OT Services    Follow Up Recommendations  Skilled nursing facility    Barriers to Discharge      Equipment Recommendations  Defer to next venue    Recommendations for Other Services    Frequency  Min 2X/week    Precautions / Restrictions Precautions Precautions: Fall Precaution Comments: Brace in knee extension Required Braces or Orthoses: Knee Immobilizer - Right Restrictions Weight Bearing Restrictions: Yes RLE Weight Bearing: Weight bearing as tolerated   Pertinent Vitals/Pain Facial grimace once    ADL  Eating/Feeding: Performed;Set up Where Assessed - Eating/Feeding: Chair Grooming: Performed;Wash/dry face;Wash/dry hands;Set up (min v/c) Where Assessed - Grooming: Supported sitting Lower Body Bathing: Performed;Minimal assistance (mod (A) for balance) Where Assessed - Lower Body Bathing: Supported standing Toilet Transfer: Performed;Moderate assistance Toilet Transfer Method: Stand pivot Toilet Transfer Equipment: Raised toilet seat with arms (or 3-in-1 over toilet) Toileting - Clothing Manipulation and Hygiene: Performed;Moderate assistance Where Assessed - Toileting Clothing Manipulation and Hygiene: Sit to stand from 3-in-1 or toilet ADL Comments: pt in chair on arrival. Pt stand pivot to the Lt chair<>3N1 and Right back to chair. Pt voiding bladder ~50 ccs.    OT Diagnosis: Generalized weakness;Acute pain  OT Problem List: Decreased activity tolerance;Impaired balance (sitting and/or standing);Decreased knowledge of use of DME or AE;Decreased safety awareness;Decreased knowledge of  precautions;Pain OT Treatment Interventions: Self-care/ADL training;Therapeutic exercise;DME and/or AE instruction;Therapeutic activities;Patient/family education;Balance training   OT Goals Acute Rehab OT Goals OT Goal Formulation: With patient Time For Goal Achievement: 05/30/12 Potential to Achieve Goals: Good ADL Goals Pt Will Perform Lower Body Bathing: with set-up;Sitting, chair;Sitting, edge of bed;Supported;with adaptive equipment ADL Goal: Lower Body Bathing - Progress: Goal set today Pt Will Perform Lower Body Dressing: with set-up;Sit to stand from chair;Sit to stand from bed;with adaptive equipment ADL Goal: Lower Body Dressing - Progress: Goal set today Pt Will Transfer to Toilet: with supervision;3-in-1 ADL Goal: Toilet Transfer - Progress: Goal set today Pt Will Perform Toileting - Clothing Manipulation: with set-up;Sitting on 3-in-1 or toilet ADL Goal: Toileting - Clothing Manipulation - Progress: Goal set today Pt Will Perform Toileting - Hygiene: with set-up;Sit to stand from 3-in-1/toilet ADL Goal: Toileting - Hygiene - Progress: Goal set today  Visit Information  Last OT Received On: 05/16/12 Assistance Needed: +2 (for mobility)    Subjective Data  Subjective: "yes"- head nods Patient Stated Goal: unable to verbalize   Prior Functioning  Home Living Lives With: Alone Available Help at Discharge: Skilled Nursing Facility Additional Comments: pt has had some falls at home and plans for SNF at D/C.   Prior Function Level of Independence: Independent Vocation: Retired Comments: ? how well pt had been doing at home.  (NO FAMILY PRESENT) Communication Communication: Expressive difficulties Dominant Hand: Right    Cognition  Overall Cognitive Status: Difficult to assess Difficult to assess due to:  (Difficult to understand pt.  ) Arousal/Alertness: Awake/alert Orientation Level: Appears intact for tasks assessed Behavior During Session: [facility] for tasks  performed Cognition - Other Comments: pt follows all one step directions, but occasionally seems to look off in space.      Extremity/Trunk Assessment Right Upper  Extremity Assessment RUE ROM/Strength/Tone: Within functional levels Left Upper Extremity Assessment LUE ROM/Strength/Tone: Within functional levels Right Lower Extremity Assessment RLE ROM/Strength/Tone: Deficits RLE ROM/Strength/Tone Deficits: R Patellar fx in bledsoe brace locked in extension.   RLE Sensation: WFL - Light Touch Left Lower Extremity Assessment LLE ROM/Strength/Tone: WFL for tasks assessed LLE Sensation: WFL - Light Touch   Mobility Bed Mobility Bed Mobility: Rolling Right;Supine to Sit;Sitting - Scoot to [place] of Bed Rolling Right: 4: Min assist;With rail Supine to Sit: 1: +2 Total assist Supine to Sit: Patient Percentage: 50% Sitting - Scoot to Edge of Bed: 1: +2 Total assist Sitting - Scoot to Edge of Bed: Patient Percentage: 40% Details for Bed Mobility Assistance: cues for safe technique, encouragement, sequencing.   Transfers Transfers: Sit to Stand;Stand to Sit Sit to Stand: 3: Mod assist Sit to Stand: Patient Percentage: 40% Stand to Sit: 3: Mod assist Stand to Sit: Patient Percentage: 40% Details for Transfer Assistance: facilitation at hips and to shift weight   Exercise    Balance Balance Balance Assessed: No  End of Session OT - End of Session Activity Tolerance: Patient tolerated treatment well Patient left: in chair;with call bell/phone within reach (with lunch)   Harrel Carina [facility] 05/16/2012, 12:11 PM Pager: [phone]

## 2012-05-16 NOTE — Progress Notes (Signed)
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  She apparently has had progressively worsening memory, lives alone and has limited mobility.  Evaluation demonstrated R patellar fracture.      Tests / Events: 5/13 Knee>>>Transverse patellar fracture noted with overlying soft tissue swelling and moderate suprapatellar effusion.  No dislocation.  5/13 CXR>>>No acute cardiopulmonary process. 5/16>>> Patellar repair under spinal anesthesia   Vital Signs: Temp:  [97.1 F (36.2 C)-99.2 F (37.3 C)] 97.1 F (36.2 C) (05/17 0550) Pulse Rate:  [67-86] 67  (05/17 0550) Resp:  [16-18] 18  (05/17 0550) BP: (132-173)/(48-90) 132/48 mmHg (05/17 0550) SpO2:  [94 %-100 %] 97 % (05/17 0550) FiO2 (%):  [28 %] 28 % (05/16 2019) Weight:  [62.506 kg (137 lb 12.8 oz)] 62.506 kg (137 lb 12.8 oz) (05/16 1707) I/O last 3 completed shifts: In: 2482.5 [P.O.:720; I.V.:1762.5] Out: -   Physical Examination: Gen: sitting up in chair, comfortable HEENT: bruise over R eye, OP clear PULM: Insp crackles in bases, no wheezing CV: RRR, no mgr AB: BS+, soft, nontender Ext: R knee in brace Neuro: expressive aphasia, maew   Labs    CBC  Lab 05/16/12 0612 05/15/12 0615 05/13/12 0630  HGB 12.2 12.6 12.9  HCT 37.2 38.3 38.8  WBC 8.5 7.6 10.0  PLT 215 209 199     BMET  Lab 05/16/12 0612 05/15/12 0615 05/14/12 1115 05/13/12 0630  NA 138 143 142 --  K 4.0 3.7 -- --  CL 101 106 107 --  CO2 29 25 26  --  GLUCOSE 115* 101* 154* --  BUN 13 17 18  --  CREATININE 0.71 0.69 0.72 0.64  CALCIUM 9.2 9.3 9.2 --  MG -- -- -- --  PHOS -- -- -- --      Assessment and Plan: Principal Problem:  *Patella fracture, right, closed, initial  encounter Active Problems:  Tobacco use disorder  SEIZURE DISORDER, COMPLEX PARTIAL  COPD (chronic obstructive pulmonary disease)  AAA (abdominal aortic aneurysm)  CAD (coronary artery disease)  Hypoxemia  Wheezes  S/p fall with R closed Patellar Fracture.   Assessment:  S/p patellar repair Plan: -per ortho  Pulmonary Pre-Operative Clearance  Assessment: doing well this AM post OP Plan: -continue O2 -incentive spirometry encouraged -continue albuterol / pulmicort (home med)  PCCM to sign off, call if questions  Best practices / Disposition: -->Code Status: Full Code -->DVT Px: lovenox -->GI Px: not indicated -->Diet: PO    Yolonda Kida PCCM Pager: [phone] Cell: [phone] If no response, call [phone]

## 2012-05-16 NOTE — Op Note (Signed)
[name], [name] NO.:  [id number]  MEDICAL RECORD NO.:  [id number]  LOCATION:  WOTF                         FACILITY:  [facility]  PHYSICIAN:  Doralee Albino. Carola Frost, M.D. DATE OF BIRTH:  November 19, 1945  DATE OF PROCEDURE:  05/15/2012 DATE OF DISCHARGE:                              OPERATIVE REPORT   PREOPERATIVE DIAGNOSIS:  Right patella fracture.  POSTOPERATIVE DIAGNOSIS:  Right patella fracture.  PROCEDURE:  Open reduction and internal fixation of right patella with repair of the extensor mechanism and partial excision.  SURGEON:  Doralee Albino. Carola Frost, MD  ASSISTING:  Mearl Latin, PA-C  SECOND ASSISTANT:  PA student.  ANESTHESIA:  Spinal.  COMPLICATIONS:  None.  TOURNIQUET:  None.  ESTIMATED BLOOD LOSS:  Minimal.  DISPOSITION:  To PACU.  CONDITION:  Stable.  BRIEF SUMMARY AND INDICATION FOR PROCEDURE:  [name] [name] is a 67 year old female who sustained a displaced right patella fracture on a ground level fall.  She was seen and evaluated by both Pulmonology and Cardiology preoperatively, who found her end-stage pulmonary artery disease to be very significant and life threatening in fact and recommended avoiding general anesthesia.  I did discuss this in detail with all of the children and also explained the potential history that would be expected with nonsurgical management of this patella fracture including fibrous nonunion and others.  They did wish to proceed with repair, understanding the very real risk of complication.  Dr. Noreene Larsson had discussed this with them as well from an anesthetic perspective preoperatively both yesterday and today, and a spinal was performed and accomplished without difficulty.  BRIEF SUMMARY OF PROCEDURE:  After administration of the spinal anesthetic, [name] [name] was positioned supine and a midline incision was made directly over the patella.  Dissection was carried down to the fracture site.  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 The distal pole segment that was highly comminuted was excised and a #5 FiberWire suture was used to secure the tendon and the fracture fragments at the base of the distal pole using a Krackow technique to bring the suture back up and through small drill holes in the patella and tying them over top.  An additional suture was passed around the pole and into the tendon.  I was able to passed the needle directly through the intact patella.  The peritenon layer was closed over top.  I did evacuate the knee thoroughly prior to this repair and irrigated, and removed all hematoma and fracture fragments.  My assistant, Montez Morita, was present and helpful throughout the case with maintaining apposition of the fracture fragments and patella during the repair as well as with assist me with wound closure.  She was placed in the knee immobilizer and transported to the PACU in stable condition.  She tolerated the procedure very well and we did thing together during the procedure.  PROGNOSIS:  The patient will be weightbearing as tolerated and full extension in the knee immobilizer.  She will not have any unsupervised range of motion of the knee.  I have remained very concerned about her pulmonary function  about her continued 2-pack-a-day smoking habit and intermittent confusion.  These things certainly increase the chance of complications considerably, but she has excellent family and social support.  She will go back on her Lovenox tomorrow and then resume her aspirin.  She is at risk for loss of reduction and other perioperative complications.     Doralee Albino. Carola Frost, M.D.     MHH/MEDQ  D:  05/15/2012  T:  05/16/2012  Job:  [id number]

## 2012-05-16 NOTE — Progress Notes (Addendum)
Clinical Social Worker (CSW) spoke with provided pt daughter Luciana Axe (over the phone) with bed offers that pt has received. [name] stated her brother would be arriving to the hospital shortly and at that time they would make a decision and inform CSW with their preferred facility. CSW informed daughter that if pt is sitter free for 24hrs there is a possibility, if pt is stable, that she could dc tomorrow. [name] was appreciative of update and will be in touch with CSW.  Theresia Bough, MSW, [facility] [phone]

## 2012-05-16 NOTE — Progress Notes (Signed)
Daily Progress Note Si [name] [name]. Clinton Sawyer, M.D., M.B.A  Family Medicine PGY-1 Pager [phone]  Subjective:  Events on 5/16 - Stress myoview performed by cardiology and was normal (EF 70%); therefore taken to [place] by Dr. Carola Frost for ORIF of right patella; anesthesia was spinal at request of family and recommendation of pulmonology team - We are very appreciate of the efforts of all consulting physicians for taking care of [name] [name] in such an efficient manner.   Last night she was noted to have some delirium and agitation. This improved with a sitter, and she did not require any haldol. This morning she is very pleasant, but showing signs of delirium. Her daughter thinks she may be having visual hallucinations, as she is pointing at various spots in the room and talking. Her daughter [name] notes that she has had delirium at other hospitalizations. The patient denies any pain or discomfort.   Objective: Vital signs in last 24 hours: Temp:  [97.1 F (36.2 C)-99.2 F (37.3 C)] 97.1 F (36.2 C) (05/17 0550) Pulse Rate:  [67-100] 67  (05/17 0550) Resp:  [16-18] 18  (05/17 0550) BP: (127-173)/(44-90) 132/48 mmHg (05/17 0550) SpO2:  [94 %-100 %] 97 % (05/17 0550) FiO2 (%):  [28 %] 28 % (05/16 2019) Weight:  [137 lb 12.8 oz (62.506 kg)] 137 lb 12.8 oz (62.506 kg) (05/16 1707) Weight change:  Last BM Date: 05/12/12  Intake/Output from previous day: 05/16 0701 - 05/17 0700 In: 2242.5 [P.O.:480; I.V.:1762.5] Out: -  Intake/Output this shift: Total I/O In: 240 [P.O.:240] Out: -  General: elderly WF, pleasant, non-distressed, sponded to yes-no questions  HEENT: ecchymosis of right brow, EOMI, PERRLA, OP clear and dry  Heart: S1, S2 normal, no murmur, rub or gallop, regular rate and rhythm  Lungs: wheezing, expiratory slowing and decreased breath sounds  Abdomen: abdomen is soft without significant tenderness, masses, organomegaly or guarding  Extremities: swelling and tenderness right right knee;  large ecchymosis on dorsum of right hand, tenderness to palpation of right hand  Skin:scattered ecchymoses and abrasions, worst on right hand Neurology: CN II-XII grossly intact; right sided grip weakness; expressive aphasia  Psych: may be responding to external stimuli, but very difficult to tell with her aphasia; not agitated     Lab Results:  [facility] 05/16/12 0612 05/15/12 0615  WBC 8.5 7.6  HGB 12.2 12.6  HCT 37.2 38.3  PLT 215 209   BMET  Basename 05/16/12 0612 05/15/12 0615  NA 138 143  K 4.0 3.7  CL 101 106  CO2 29 25  GLUCOSE 115* 101*  BUN 13 17  CREATININE 0.71 0.69  CALCIUM 9.2 9.3    Studies/Results:   Medications: I have reviewed the patient's current medications.  Assessment/Plan: [name] [name] is a 67 y.o. year old female presenting with a right patellar fracture after a fall at home. This was successfully repaired by Dr. Carola Frost on 05/15/12. Now we are awaiting placement at a SNF.   # Right patellar fracture, transverse - occurred 5/13  - successful surgery on 5/16; post-op recs to be provided by orthopedic surgery team; very appreciative of their help  # AMS - Onset 5/16 post-operative; This is likely a combination of medication induced and sleep deprivation. It is difficult to tell if the patient is responding to external stimuli. She is not agitated currently, and her daughter is present. - D/C sitter as pt needs to be sitter-free for 24 hours before d/c to SNF - d/c Haldol PRN as not  needed overnight  # CAD  - continue home ASA, statin, metoprolol   # COPD - has increase O2 requirement to 3L, likely 2/2 atelectasis  - continue budesonide BID, and albuterol TID  - continue incentive spirometry today   # Tobacco Abuse - smoke 2 PPD  - nicotine patch 21 mg  # Seizure disorder  - continue home Keppra   # FENGI - regular diet, but NPO after midnight; LR at 50 cc/hr  # PPX - lovenox for 14 days for surgery rec  # Disposition - poss d/c to SNF  after 24 hours of being sitter free     LOS: 4 days   [name] 05/16/2012, 9:12 AM

## 2012-05-17 MED ORDER — OXYCODONE HCL 5 MG PO TABS: 2.5000 mg | ORAL_TABLET | ORAL | Status: AC | PRN

## 2012-05-17 MED ORDER — POLYETHYLENE GLYCOL 3350 17 G PO PACK: 17.0000 g | PACK | Freq: Every day | ORAL | Status: AC | PRN

## 2012-05-17 MED ORDER — POLYETHYLENE GLYCOL 3350 17 G PO PACK: 17.0000 g | PACK | Freq: Every day | ORAL | Status: DC | PRN

## 2012-05-17 MED ORDER — DOCUSATE SODIUM 50 MG/5ML PO LIQD: 100.0000 mg | Freq: Two times a day (BID) | ORAL | Status: DC

## 2012-05-17 MED ORDER — OXYCODONE HCL 5 MG PO TABS
5.0000 mg | ORAL_TABLET | ORAL | Status: DC | PRN
  Administered 2012-05-17: 5 mg via ORAL
  Filled 2012-05-17: qty 1

## 2012-05-17 MED ORDER — GLYCERIN (LAXATIVE) 2.1 G RE SUPP
1.0000 | Freq: Once | RECTAL | Status: DC
  Filled 2012-05-17: qty 1

## 2012-05-17 MED ORDER — DOCUSATE SODIUM 50 MG/5ML PO LIQD: 100.0000 mg | Freq: Two times a day (BID) | ORAL | Status: AC

## 2012-05-17 NOTE — Progress Notes (Signed)
Pt to transfer to [facility] today via [facility]. Pt, pt's family (at bedside), and SNF aware of d/c. RN called report to [facility] RN. CSW signing off as no other CSW needs reported or noted.  Dellie Burns, MSW, [place] [phone] (weekend)

## 2012-05-17 NOTE — Progress Notes (Signed)
Physical Therapy Treatment Note   05/17/12 1110  PT Visit Information  Last PT Received On 05/17/12  Assistance Needed +2  PT Time Calculation  PT Start Time 1110  PT Stop Time 1133  PT Time Calculation (min) 23 min  Subjective Data  Subjective Pt received sitting up in chair. Patient family present and encouraged patient to participate in PT.  Precautions  Precautions Fall  Precaution Comments Brace in knee extension  Required Braces or Orthoses (R bledsoe brace)  Restrictions  RLE Weight Bearing WBAT (with bledsoe brace on)  Cognition  Overall Cognitive Status Difficult to assess  Arousal/Alertness Awake/alert  Orientation Level Appears intact for tasks assessed  Behavior During Session [facility] for tasks performed  Cognition - Other Comments pt appear to have STM deficits and maybe mild confusion  Bed Mobility  Bed Mobility Not assessed (pt received sitting up in chair)  Transfers  Transfers Sit to Stand;Stand to Sit;Stand Pivot Transfers  Sit to Stand 3: Mod assist  Sit to Stand: Patient Percentage 50%  Stand to Sit 3: Mod assist  Stand to Sit: Patient Percentage 50%  Stand Pivot Transfers 2: Max assist  Stand Pivot Transfers: Patient Percentage 50%  Details for Transfer Assistance max assist for walker management during transfer and max directional v/c's  Ambulation/Gait  Ambulation/Gait Assistance 3: Mod assist  Ambulation Distance (Feet) 12 Feet  Assistive device Rolling walker  Ambulation/Gait Assistance Details max v/c's for sequencing, pt with improved abiltiy to advance R LE I'ly  Gait Pattern Step-to pattern;Decreased step length - right;Decreased stance time - right  Gait velocity slow  Stairs No  PT - End of Session  Equipment Utilized During Treatment Gait belt (R bledsoe brace)  Activity Tolerance Patient limited by fatigue;Patient limited by pain  Patient left in chair;with call bell/phone within reach;with family/visitor present  Nurse Communication  Mobility status  PT - Assessment/Plan  Comments on Treatment Session Pt assisted to [facility]. Patient dependent for hygiene in standing. patient with significant progress compared to eval however remains to benefit from SNF placement due to increased assist required for all mobiltiy and ADLs, confusion, and increased fall risk.  PT Plan Discharge plan remains appropriate;Frequency remains appropriate  PT Frequency Min 3X/week  Follow Up Recommendations Skilled nursing facility  Equipment Recommended Defer to next venue  Acute Rehab PT Goals  Time For Goal Achievement 05/30/12  Potential to Achieve Goals Good  PT Goal: Sit to Stand - Progress Progressing toward goal  PT Goal: Stand to Sit - Progress Progressing toward goal  PT Goal: Ambulate - Progress Progressing toward goal     Pain: pt reports "It always hurts" in reference to R knee but unable to rate.  Lewis Shock, PT, DPT Pager #: [phone] Office #: [phone]

## 2012-05-17 NOTE — Progress Notes (Signed)
Patient ID: [name] [name], female   DOB: 04-04-1945, 67 y.o.   MRN: [id number] Subjective:  No dyspnea. C/o soreness in leg  Objective:  Vital Signs in the last 24 hours: Temp:  [97.9 F (36.6 C)-98.4 F (36.9 C)] 97.9 F (36.6 C) (05/18 0528) Pulse Rate:  [67-88] 72  (05/18 0528) Resp:  [18] 18  (05/18 0528) BP: (131-136)/(50-64) 134/64 mmHg (05/18 0528) SpO2:  [85 %-98 %] 85 % (05/18 0747)  Intake/Output from previous day: 05/17 0701 - 05/18 0700 In: 680 [P.O.:480; IV Piggyback:200] Out: -  Intake/Output from this shift:    Physical Exam: Well appearing NAD HEENT: Unremarkable Neck:  No JVD, no thyromegally Lungs:  Clear with no wheezes. HEART:  Regular rate rhythm, no murmurs, no rubs, no clicks Abd:  soft, positive bowel sounds, no organomegally, no rebound, no guarding Ext:  2 plus pulses, no edema, no cyanosis, no clubbing Skin:  No rashes no nodules Neuro:  CN II through XII intact, motor grossly intact  Lab Results:  Basename 05/16/12 0612 05/15/12 0615  WBC 8.5 7.6  HGB 12.2 12.6  PLT 215 209    Basename 05/16/12 0612 05/15/12 0615  NA 138 143  K 4.0 3.7  CL 101 106  CO2 29 25  GLUCOSE 115* 101*  BUN 13 17  CREATININE 0.71 0.69   No results found for this basename: TROPONINI:2,CK,MB:2 in the last 72 hours Hepatic Function Panel No results found for this basename: PROT,ALBUMIN,AST,ALT,ALKPHOS,BILITOT,BILIDIR,IBILI in the last 72 hours No results found for this basename: CHOL in the last 72 hours No results found for this basename: PROTIME in the last 72 hours  Imaging: Nm Myocar Multi W/spect W/wall Motion / Ef  05/15/2012  *RADIOLOGY REPORT*  Clinical Data:  Preop evaluation.  67 year old female with coronary artery disease.  MYOCARDIAL IMAGING WITH SPECT (REST AND PHARMACOLOGIC-STRESS) GATED LEFT VENTRICULAR WALL MOTION STUDY LEFT VENTRICULAR EJECTION FRACTION  Technique:  Resting myocardial SPECT imaging was initially performed after intravenous  administration of radiopharmaceutical. Myocardial SPECT was subsequently performed after additional radiopharmaceutical injection during pharmacologic-stress supervised by the Cardiology staff.  Quantitative gated imaging was also performed to evaluate left ventricular wall motion, and estimate left ventricular ejection fraction.  Radiopharmaceutical:  Tc-62m Myoview at rest and during stress.  Comparison: none  Findings:  Technique: Study is adequate.  Perfusion:  There are no relative decreased counts on stress or rest to suggest reversible ischemia or infarction. A small defect within the anterior septal wall is fix.  This may represent a right ventricular insertion.  Wall motion:  No focal wall motion abnormality.  Normal contractility.  Left ventricular ejection fraction:  Calculated left ventricular ejection fraction =  greater than 70%  IMPRESSION:  1.  No reversible ischemia or infarction. 2.  Normal wall motion. 3.  Left ventricular ejection fraction equal greater than 70%  Original Report Authenticated By: Genevive Bi, M.D.   Dg Knee Right Port  05/16/2012  *RADIOLOGY REPORT*  Clinical Data: Right patellar fracture.  Postoperative for repair and excision of articular fragment.  PORTABLE RIGHT KNEE - 1-2 VIEW  Comparison: 05/12/2012  Findings: Skin staples noted.  Operative reduction and internal fixation of the patella noted, with apparent removal of the lower pole fragment.  Postoperative gas in the suprapatellar bursa and subcutaneous tissues noted.  No new fracture or complicating feature observed.  IMPRESSION: 1.  Interval operative reduction and internal fixation of patellar fracture.  Original Report Authenticated By: Dellia Cloud, M.D.  Cardiac Studies: none Assessment/Plan:  1.CAD - stable. No additional rec's. 2. COPD - respiratory function stable.  3. Continue supportive care. Will sign off.  LOS: 5 days    Lewayne Bunting, M.D. 05/17/2012, 10:37 AM

## 2012-05-17 NOTE — Progress Notes (Signed)
FMTS Attending Note  Patient seen and examined by me, discussed with resident team.  Patient's children at bedside this morning.  She had a good night, without agitation.  Is having some constipation (usually controlled with prunes in her diet).  Opioid pain meds may be aggravating this problem.   She is medically appropriate for discharge to SNF/rehab, to add stool softener to her medication regimen.   Paula Compton, MD

## 2012-05-17 NOTE — Progress Notes (Signed)
DISCHARGE SUMMARY ADDENDUM  Subjective: No acute events overnight.  Patient slept through the night, no agitation or anxiety.  Today, patient denies pain but points to her bladder because she needs to use the bathroom.  I called Nurse Tech to assist patient.  Objective: Vital signs in last 24 hours: Temp:  [97.9 F (36.6 C)-98.4 F (36.9 C)] 97.9 F (36.6 C) (05/18 0528) Pulse Rate:  [67-88] 72  (05/18 0528) Resp:  [18] 18  (05/18 0528) BP: (131-136)/(50-64) 134/64 mmHg (05/18 0528) SpO2:  [85 %-98 %] 85 % (05/18 0747) Weight change:  Last BM Date:  (pt unable to state)  Intake/Output from previous day: 05/17 0701 - 05/18 0700 In: 680 [P.O.:480; IV Piggyback:200] Out: -  Intake/Output this shift:   PHYSICAL EXAM: General appearance: alert, cooperative and no distress Head: Normocephalic, without obvious abnormality, atraumatic Throat: lips, mucosa, and tongue normal; teeth and gums normal Cardio: regular rate and rhythm, S1, S2 normal, no murmur, click, rub or gallop GI: soft, non-tender; bowel sounds normal; no masses,  no organomegaly Extremities: Post-op shoe in place RT lower extremity.  LT lower extremity - no C/C/E  Lab Results:  Basename 05/16/12 0612 05/15/12 0615  WBC 8.5 7.6  HGB 12.2 12.6  HCT 37.2 38.3  PLT 215 209   BMET  Basename 05/16/12 0612 05/15/12 0615  NA 138 143  K 4.0 3.7  CL 101 106  CO2 29 25  GLUCOSE 115* 101*  BUN 13 17  CREATININE 0.71 0.69  CALCIUM 9.2 9.3    Studies/Results: Nm Myocar Multi W/spect W/wall Motion / Ef  05/15/2012  *RADIOLOGY REPORT*  Clinical Data:  Preop evaluation.  67 year old female with coronary artery disease.  MYOCARDIAL IMAGING WITH SPECT (REST AND PHARMACOLOGIC-STRESS) GATED LEFT VENTRICULAR WALL MOTION STUDY LEFT VENTRICULAR EJECTION FRACTION  Technique:  Resting myocardial SPECT imaging was initially performed after intravenous administration of radiopharmaceutical. Myocardial SPECT was subsequently  performed after additional radiopharmaceutical injection during pharmacologic-stress supervised by the Cardiology staff.  Quantitative gated imaging was also performed to evaluate left ventricular wall motion, and estimate left ventricular ejection fraction.  Radiopharmaceutical:  Tc-86m Myoview at rest and during stress.  Comparison: none  Findings:  Technique: Study is adequate.  Perfusion:  There are no relative decreased counts on stress or rest to suggest reversible ischemia or infarction. A small defect within the anterior septal wall is fix.  This may represent a right ventricular insertion.  Wall motion:  No focal wall motion abnormality.  Normal contractility.  Left ventricular ejection fraction:  Calculated left ventricular ejection fraction =  greater than 70%  IMPRESSION:  1.  No reversible ischemia or infarction. 2.  Normal wall motion. 3.  Left ventricular ejection fraction equal greater than 70%  Original Report Authenticated By: Genevive Bi, M.D.   Dg Knee Right Port  05/16/2012  *RADIOLOGY REPORT*  Clinical Data: Right patellar fracture.  Postoperative for repair and excision of articular fragment.  PORTABLE RIGHT KNEE - 1-2 VIEW  Comparison: 05/12/2012  Findings: Skin staples noted.  Operative reduction and internal fixation of the patella noted, with apparent removal of the lower pole fragment.  Postoperative gas in the suprapatellar bursa and subcutaneous tissues noted.  No new fracture or complicating feature observed.  IMPRESSION: 1.  Interval operative reduction and internal fixation of patellar fracture.  Original Report Authenticated By: Dellia Cloud, M.D.    Medications: I have reviewed the patient's current medications.  Assessment/Plan:  Disposition: Please refer to Discharge  Summaries for complete Assessment and Plan.  Overnight, patient slept well without any anxiety or agitation.  Patient did not need a sitter or any pharmacologic treatment in the last  24 hours.  Proceed with discharge to [facility] today.  Patient medically stable for discharge today.     LOS: 5 days   DE LA CRUZ,Gotti Alwin 05/17/2012, 8:44 AM

## 2012-05-19 ENCOUNTER — Ambulatory Visit: Payer: Medicare Other | Admitting: Family Medicine

## 2012-05-20 NOTE — Progress Notes (Signed)
I have seen and examined the patient. I agree with the findings above.  Budd Palmer, MD 05/20/2012 5:09 PM

## 2012-05-22 ENCOUNTER — Ambulatory Visit: Payer: Medicare Other

## 2012-06-09 ENCOUNTER — Ambulatory Visit: Payer: Medicare Other | Admitting: Family Medicine

## 2012-06-19 ENCOUNTER — Telehealth: Payer: Self-pay | Admitting: Family Medicine

## 2012-06-19 NOTE — Telephone Encounter (Signed)
Daughter is calling to give Dr. Madolyn Frieze an update on [name] [name].  She will be going to an Assisted Living from [facility] and the daughter would like to discuss this further.

## 2012-06-24 NOTE — Telephone Encounter (Signed)
LVM. Advised to leave message with front office or come and make appointment.

## 2014-02-28 ENCOUNTER — Emergency Department (HOSPITAL_COMMUNITY): Payer: Medicare Other

## 2014-02-28 ENCOUNTER — Encounter (HOSPITAL_COMMUNITY): Payer: Self-pay | Admitting: Emergency Medicine

## 2014-02-28 ENCOUNTER — Emergency Department (HOSPITAL_COMMUNITY)
Admission: EM | Admit: 2014-02-28 | Discharge: 2014-02-28 | Disposition: A | Discharge status: Skilled Nursing Facility | Payer: Medicare Other | Attending: Emergency Medicine | Admitting: Emergency Medicine

## 2014-02-28 DIAGNOSIS — I2510 Atherosclerotic heart disease of native coronary artery without angina pectoris: Secondary | ICD-10-CM | POA: Insufficient documentation

## 2014-02-28 DIAGNOSIS — F0390 Unspecified dementia without behavioral disturbance: Secondary | ICD-10-CM | POA: Insufficient documentation

## 2014-02-28 DIAGNOSIS — Y939 Activity, unspecified: Secondary | ICD-10-CM | POA: Insufficient documentation

## 2014-02-28 DIAGNOSIS — Z8719 Personal history of other diseases of the digestive system: Secondary | ICD-10-CM | POA: Insufficient documentation

## 2014-02-28 DIAGNOSIS — Z880 Allergy status to penicillin: Secondary | ICD-10-CM | POA: Insufficient documentation

## 2014-02-28 DIAGNOSIS — S42309A Unspecified fracture of shaft of humerus, unspecified arm, initial encounter for closed fracture: Secondary | ICD-10-CM

## 2014-02-28 DIAGNOSIS — R42 Dizziness and giddiness: Secondary | ICD-10-CM | POA: Insufficient documentation

## 2014-02-28 DIAGNOSIS — J449 Chronic obstructive pulmonary disease, unspecified: Secondary | ICD-10-CM | POA: Insufficient documentation

## 2014-02-28 DIAGNOSIS — I10 Essential (primary) hypertension: Secondary | ICD-10-CM | POA: Insufficient documentation

## 2014-02-28 DIAGNOSIS — G40909 Epilepsy, unspecified, not intractable, without status epilepticus: Secondary | ICD-10-CM | POA: Insufficient documentation

## 2014-02-28 DIAGNOSIS — S42209A Unspecified fracture of upper end of unspecified humerus, initial encounter for closed fracture: Secondary | ICD-10-CM | POA: Insufficient documentation

## 2014-02-28 DIAGNOSIS — F17200 Nicotine dependence, unspecified, uncomplicated: Secondary | ICD-10-CM | POA: Insufficient documentation

## 2014-02-28 DIAGNOSIS — Y929 Unspecified place or not applicable: Secondary | ICD-10-CM | POA: Insufficient documentation

## 2014-02-28 DIAGNOSIS — E785 Hyperlipidemia, unspecified: Secondary | ICD-10-CM | POA: Insufficient documentation

## 2014-02-28 DIAGNOSIS — Z8673 Personal history of transient ischemic attack (TIA), and cerebral infarction without residual deficits: Secondary | ICD-10-CM | POA: Insufficient documentation

## 2014-02-28 DIAGNOSIS — Z79899 Other long term (current) drug therapy: Secondary | ICD-10-CM | POA: Insufficient documentation

## 2014-02-28 DIAGNOSIS — J4489 Other specified chronic obstructive pulmonary disease: Secondary | ICD-10-CM | POA: Insufficient documentation

## 2014-02-28 DIAGNOSIS — D649 Anemia, unspecified: Secondary | ICD-10-CM | POA: Insufficient documentation

## 2014-02-28 DIAGNOSIS — Z7982 Long term (current) use of aspirin: Secondary | ICD-10-CM | POA: Insufficient documentation

## 2014-02-28 DIAGNOSIS — R296 Repeated falls: Secondary | ICD-10-CM | POA: Insufficient documentation

## 2014-02-28 DIAGNOSIS — F411 Generalized anxiety disorder: Secondary | ICD-10-CM | POA: Insufficient documentation

## 2014-02-28 LAB — CBC WITH DIFFERENTIAL/PLATELET
BASOS ABS: 0 10*3/uL (ref 0.0–0.1)
BASOS PCT: 0 % (ref 0–1)
EOS PCT: 0 % (ref 0–5)
Eosinophils Absolute: 0 10*3/uL (ref 0.0–0.7)
HEMATOCRIT: 40.6 % (ref 36.0–46.0)
Hemoglobin: 13.6 g/dL (ref 12.0–15.0)
Lymphocytes Relative: 12 % (ref 12–46)
Lymphs Abs: 1.5 10*3/uL (ref 0.7–4.0)
MCH: 32.4 pg (ref 26.0–34.0)
MCHC: 33.5 g/dL (ref 30.0–36.0)
MCV: 96.7 fL (ref 78.0–100.0)
MONO ABS: 0.6 10*3/uL (ref 0.1–1.0)
Monocytes Relative: 5 % (ref 3–12)
NEUTROS ABS: 10.1 10*3/uL — AB (ref 1.7–7.7)
Neutrophils Relative %: 83 % — ABNORMAL HIGH (ref 43–77)
Platelets: 198 10*3/uL (ref 150–400)
RBC: 4.2 MIL/uL (ref 3.87–5.11)
RDW: 13 % (ref 11.5–15.5)
WBC: 12.2 10*3/uL — ABNORMAL HIGH (ref 4.0–10.5)

## 2014-02-28 LAB — COMPREHENSIVE METABOLIC PANEL
ALBUMIN: 3.8 g/dL (ref 3.5–5.2)
ALT: 20 U/L (ref 0–35)
AST: 22 U/L (ref 0–37)
Alkaline Phosphatase: 61 U/L (ref 39–117)
BUN: 19 mg/dL (ref 6–23)
CALCIUM: 9.7 mg/dL (ref 8.4–10.5)
CO2: 26 mEq/L (ref 19–32)
CREATININE: 0.9 mg/dL (ref 0.50–1.10)
Chloride: 102 mEq/L (ref 96–112)
GFR calc Af Amer: 73 mL/min — ABNORMAL LOW (ref >90)
GFR calc non Af Amer: 63 mL/min — ABNORMAL LOW (ref >90)
Glucose, Bld: 135 mg/dL — ABNORMAL HIGH (ref 70–99)
Potassium: 4.2 mEq/L (ref 3.7–5.3)
Sodium: 142 mEq/L (ref 137–147)
TOTAL PROTEIN: 6.8 g/dL (ref 6.0–8.3)
Total Bilirubin: 0.3 mg/dL (ref 0.3–1.2)

## 2014-02-28 MED ORDER — MORPHINE SULFATE 4 MG/ML IJ SOLN
4.0000 mg | Freq: Once | INTRAMUSCULAR | Status: DC
  Filled 2014-02-28: qty 1

## 2014-02-28 MED ORDER — MORPHINE SULFATE 4 MG/ML IJ SOLN: 2.0000 mg | Freq: Once | INTRAMUSCULAR | Status: DC

## 2014-02-28 MED ORDER — HYDROCODONE-ACETAMINOPHEN 5-325 MG PO TABS: 1.0000 | ORAL_TABLET | Freq: Four times a day (QID) | ORAL | Status: DC | PRN

## 2014-02-28 MED ORDER — SODIUM CHLORIDE 0.9 % IV BOLUS (SEPSIS)
1000.0000 mL | Freq: Once | INTRAVENOUS | Status: AC
  Administered 2014-02-28: 1000 mL via INTRAVENOUS

## 2014-02-28 NOTE — ED Notes (Addendum)
Pt arrived by [facility] from [facility] with c/o fall and right arm and hand pain. Pt stated that she felt dizzy, diaphoretic and pale prior to fall and has been dizzy some with EMS. No deformity noted and is able to move wrist and fingers. Bruising noted to right hand. Denies any neck, back, head or chest pain. BP-120/50 HR-64 regular  CBG-139

## 2014-02-28 NOTE — ED Provider Notes (Signed)
CSN: [id number]     Arrival date & time 02/28/14  0737 History   First MD Initiated Contact with Patient 02/28/14 0919     Chief Complaint  Patient presents with  . Fall  . Arm Pain     (Consider location/radiation/quality/duration/timing/severity/associated sxs/prior Treatment) HPI Level V caveat secondary to dementia.  Patient presents after a witnessed mechanical fall.  Per EMS nursing staff reports that the patient had an episode of dizziness, fell.  Since that time she has been ambulatory, has had no behavioral changes, but has complained persistently of pain focally about the right hand and wrist. The patient herself has no complaints, denies current dizziness. She does state that her right hand and wrist hurt, particularly with motion. No attempts at relief with anything as far.  Past Medical History  Diagnosis Date  . TIA (transient ischemic attack)   . 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 CAD (coronary artery disease) 2002    Felt to have out-of-hospital inferior MI s/p stents to prox & mid RCA 07/2001 (had brief V-fib in cath lab)  . Diverticulosis     With hx of abscess, perforation, colectomy, colostomy, colostomy repair  . Seizure disorder     Result of cerebral aneurysm  . COPD (chronic obstructive pulmonary disease)     Severe, O2 dependent 24 hours per day (chronic respiratory failure)  . AAA (abdominal aortic aneurysm)     Stent repair February 2007  . Tobacco abuse   . Dyslipidemia   . Carotid artery disease     Doppler November, 2011, 1-06% R. ICA, 26-94% LICA, atypical flow in left vertebral artery,  . Ecchymosis     Right hand  . Tobacco abuse   . Hypertension   . Anemia   . Complication of anesthesia   . Seizures   . Peripheral vascular disease     "poor circulation "  . Shortness of breath   . DEMENTIA    Past  Surgical History  Procedure Laterality Date  . Rca stent  2002    Following NSTEMI  . Abdominal aortic aneurysm repair  2007    Stent grafted.  . Colostomy      Now reversed.   . Colon surgery    . Brain surgery      Brain aneurysm, has plates and clips  . Patella fracture surgery  05/15/2012  . Colostomy closure  ?    2007  . Orif patella  05/15/2012    Procedure: OPEN REDUCTION INTERNAL (ORIF) FIXATION PATELLA;  Surgeon: Rozanna Box, MD;  Location: [place];  Service: Orthopedics;  Laterality: Right;  ORIF Patella   Family History  Problem Relation Age of Onset  . Heart disease    . Kidney failure     History  Substance Use Topics  . Smoking status: Current Every Day Smoker -- 2.00 packs/day for 55 years    Types: Cigarettes  . Smokeless tobacco: Never Used  . Alcohol Use: No   OB History   Grav Para Term Preterm Abortions TAB SAB Ect Mult Living                 Review of Systems  Unable to perform ROS: Dementia      Allergies  Penicillins  Home Medications   Current Outpatient Rx  Name  Route  Sig  Dispense  Refill  . albuterol (ACCUNEB) 1.25 MG/3ML nebulizer solution      USE 1 VIAL VIA NEBULIZER EVERY 4 HOURS AS NEEDED FOR WHEEZING OR SHORTNESS OF BREATH   300 mL   0   . albuterol (PROVENTIL HFA;VENTOLIN HFA) 108 (90 BASE) MCG/ACT inhaler   Inhalation   Inhale 2 puffs into the lungs every 4 (four) hours as needed. For shortness of breath         . EXPIRED: albuterol (PROVENTIL) (2.5 MG/3ML) 0.083% nebulizer solution   Nebulization   Take 3 mLs (2.5 mg total) by nebulization 3 (three) times daily.   75 mL   12   . aspirin 81 MG EC tablet   Oral   Take 81 mg by mouth daily.           Marland Kitchen atorvastatin (LIPITOR) 80 MG tablet      TAKE  1/2 TABLET BY MOUTH DAILY   15 tablet   3   . EXPIRED: budesonide (PULMICORT) 0.25 MG/2ML nebulizer solution   Nebulization   Take 2 mLs (0.25 mg total) by nebulization 2 (two) times daily.   120 mL   3     . Calcium Citrate-Vitamin D 500-400 MG-UNIT CHEW   Oral   Chew 1 tablet by mouth 2 (two) times daily.           Marland Kitchen levETIRAcetam (KEPPRA) 500 MG tablet   Oral   Take 500 mg by mouth 2 (two) times daily.           . metoprolol tartrate (LOPRESSOR) 25 MG tablet   Oral   Take 25 mg by mouth daily.           . multivitamin-iron-minerals-folic acid (CENTRUM) chewable tablet   Oral   Chew 1 tablet by mouth daily.            There were no vitals taken for this visit. Physical Exam  Nursing note and vitals reviewed. Constitutional: She is oriented to person, place, and time. She appears well-developed and well-nourished. She has a sickly appearance. No distress.  HENT:  Head: Normocephalic and atraumatic.  Eyes: Conjunctivae and EOM are normal.  Cardiovascular: Normal rate and regular rhythm.   Pulmonary/Chest: Effort normal and breath sounds normal. No stridor. No respiratory distress.  Abdominal: She exhibits no distension.  Musculoskeletal: She exhibits no edema.       Right shoulder: Normal.       Left shoulder: Normal.       Right elbow: Normal.      Right wrist: She exhibits decreased range of motion, tenderness, bony tenderness, swelling, effusion and deformity. She exhibits no laceration.       Left wrist: Normal.       Arms: Neurological: She is alert and oriented to person, place, and time. No cranial nerve deficit.  Skin: Skin is warm and dry.  Psychiatric: Her mood appears anxious. She is slowed. Cognition and memory are impaired.    ED Course  ORTHOPEDIC INJURY TREATMENT Date/Time: 02/28/2014 4:26 PM Performed by: Carmin Muskrat Authorized by: Carmin Muskrat Consent: Verbal consent obtained. The procedure was performed in an emergent situation. Risks and benefits: risks, benefits and alternatives were discussed Consent given by: patient (family) Required items: required blood products, implants, devices, and special equipment available Patient identity  confirmed: verbally with patient Time out: Immediately prior to procedure a "time out" was called to verify the correct patient, procedure, equipment, support staff and site/side marked as required. Injury location: upper  arm Location details: right upper arm Injury type: fracture Fracture type: supracondylar Pre-procedure neurovascular assessment: neurovascularly intact Pre-procedure distal perfusion: normal Pre-procedure neurological function: normal Pre-procedure range of motion: reduced Local anesthesia used: no Patient sedated: no Manipulation performed: no Immobilization: sling Post-procedure neurovascular assessment: post-procedure neurovascularly intact Post-procedure distal perfusion: normal Post-procedure neurological function: normal Post-procedure range of motion: unchanged Patient tolerance: Patient tolerated the procedure well with no immediate complications.   (including critical care time)   Imaging Review Dg Wrist Complete Right  02/28/2014   CLINICAL DATA:  Fall, pain  EXAM: RIGHT WRIST - COMPLETE 3+ VIEW  COMPARISON:  Right hand same day  FINDINGS: Four views of the right wrist submitted. Metallic fixation plate and screws noted in distal radius. Narrowing of radiocarpal joint space. No acute fracture or subluxation. Diffuse osteopenia.  IMPRESSION: No acute fracture or subluxation. Degenerative changes. Postsurgical changes distal radius. Diffuse osteopenia.   Electronically Signed   By: Lahoma Crocker M.D.   On: 02/28/2014 10:26   Dg Hand Complete Right  02/28/2014   CLINICAL DATA:  Fall, right hand pain  EXAM: RIGHT HAND - COMPLETE 3+ VIEW  COMPARISON:  05/13/2012  FINDINGS: Three views of the right hand submitted. There is diffuse osteopenia. Again noted narrowing of radiocarpal joint space. Stable postsurgical changes distal radius. Stable mild degenerative changes proximal interphalangeal joints. No acute fracture or subluxation.  IMPRESSION: No acute fracture or  subluxation. Stable degenerative changes. Postsurgical changes in distal radius again noted. Diffuse osteopenia.   Electronically Signed   By: Lahoma Crocker M.D.   On: 02/28/2014 10:24     EKG Interpretation   Date/Time:  Sunday February 28 2014 09:30:01 EST Ventricular Rate:  51 PR Interval:  176 QRS Duration: 67 QT Interval:  446 QTC Calculation: 411 R Axis:   72 Text Interpretation:  Sinus rhythm Borderline T abnormalities, lateral  leads Sinus rhythm T wave abnormality Artifact Abnormal ekg Confirmed by  Carmin Muskrat  MD ([id number]) on 02/28/2014 9:39:26 AM     Cardiac monitor rate 55 sinus rhythm normal Pulse oximetry 97% room air normal   Update: Patient's daughter has arrived.  She states that the patient is interacting in a typical fashion.  Patient now complains of right knee pain.  Patient can flex and extend the knee, has no deformity on palpation, but states there is no tenderness to palpation.   Patient now complains of R shoulder pain. On re-exam she appears to be in a similar state.  While resting the patient had low BP - w no new complaints.  3:06 PM Patient appears comfortable.  3:58 PM I have interpreted the XR - R humerus fracture.  Sling provided MDM   This patient with dementia presents after a witnessed fall from her nursing facility.  The patient did have mild tenderness, though this seems consistent with prior episodes.  On my exam patient is in no distress, sitting upright, complaining of pain only in the retrosternal right knee.  Cardiac monitoring, EKG are all reassuring.  Patient's vital signs are stable.  Patient's x-rays are unremarkable.  Patient is returning to a monitored facility.  She is discharged in stable condition.   Carmin Muskrat, MD 02/28/14 [phone]

## 2014-02-28 NOTE — ED Notes (Signed)
Patient transported to X-ray, family at bedside 

## 2014-02-28 NOTE — ED Notes (Signed)
Pt refused pain medication prior to transport to xray.

## 2014-02-28 NOTE — Discharge Instructions (Signed)
As discussed, it is important to follow-up with your orthopedist.  Please return here for any concerning changes in your condition.   Humerus Fracture, Treated with Immobilization The humerus is the large bone in your upper arm. You have a broken (fractured) humerus. These fractures are easily diagnosed with X-rays. TREATMENT  Simple fractures which will heal without disability are treated with simple immobilization. Immobilization means you will wear a cast, splint, or sling. You have a fracture which will do well with immobilization. The fracture will heal well simply by being held in a good position until it is stable enough to begin range of motion exercises. Do not take part in activities which would further injure your arm.  HOME CARE INSTRUCTIONS   Put ice on the injured area.  Put ice in a plastic bag.  Place a towel between your skin and the bag.  Leave the ice on for 15-20 minutes, 03-04 times a day.  If you have a cast:  Do not scratch the skin under the cast using sharp or pointed objects.  Check the skin around the cast every day. You may put lotion on any red or sore areas.  Keep your cast dry and clean.  If you have a splint:  Wear the splint as directed.  Keep your splint dry and clean.  You may loosen the elastic around the splint if your fingers become numb, tingle, or turn cold or blue.  If you have a sling:  Wear the sling as directed.  Do not put pressure on any part of your cast or splint until it is fully hardened.  Your cast or splint can be protected during bathing with a plastic bag. Do not lower the cast or splint into water.  Only take over-the-counter or prescription medicines for pain, discomfort, or fever as directed by your caregiver.  Do range of motion exercises as instructed by your caregiver.  Follow up as directed by your caregiver. This is very important in order to avoid permanent injury or disability and chronic pain. SEEK  IMMEDIATE MEDICAL CARE IF:   Your skin or nails in the injured arm turn blue or gray.  Your arm feels cold or numb.  You develop severe pain in the injured arm.  You are having problems with the medicines you were given. MAKE SURE YOU:   Understand these instructions.  Will watch your condition.  Will get help right away if you are not doing well or get worse. Document Released: 03/25/2001 Document Revised: 03/10/2012 Document Reviewed: 01/31/2011 [facility] Patient Information 2014 [place].

## 2019-10-06 ENCOUNTER — Other Ambulatory Visit: Payer: Self-pay | Admitting: Physician Assistant

## 2019-10-06 DIAGNOSIS — Z720 Tobacco use: Secondary | ICD-10-CM

## 2019-10-13 ENCOUNTER — Ambulatory Visit
Admission: RE | Admit: 2019-10-13 | Discharge: 2019-10-13 | Disposition: A | Discharge status: Home / Self Care | Payer: Medicare Other | Source: Ambulatory Visit | Attending: Physician Assistant | Admitting: Physician Assistant

## 2019-10-13 ENCOUNTER — Inpatient Hospital Stay: Admission: RE | Admit: 2019-10-13 | Payer: Medicare Other | Source: Ambulatory Visit

## 2019-10-13 ENCOUNTER — Other Ambulatory Visit: Payer: Self-pay

## 2019-10-13 DIAGNOSIS — Z720 Tobacco use: Secondary | ICD-10-CM

## 2020-12-13 ENCOUNTER — Emergency Department (HOSPITAL_COMMUNITY)

## 2020-12-13 ENCOUNTER — Other Ambulatory Visit: Payer: Self-pay

## 2020-12-13 ENCOUNTER — Emergency Department (HOSPITAL_COMMUNITY)
Admission: EM | Admit: 2020-12-13 | Discharge: 2020-12-13 | Disposition: A | Discharge status: Home / Self Care | Attending: Emergency Medicine | Admitting: Emergency Medicine

## 2020-12-13 DIAGNOSIS — Z20822 Contact with and (suspected) exposure to covid-19: Secondary | ICD-10-CM | POA: Diagnosis not present

## 2020-12-13 DIAGNOSIS — I2510 Atherosclerotic heart disease of native coronary artery without angina pectoris: Secondary | ICD-10-CM | POA: Insufficient documentation

## 2020-12-13 DIAGNOSIS — J449 Chronic obstructive pulmonary disease, unspecified: Secondary | ICD-10-CM | POA: Diagnosis not present

## 2020-12-13 DIAGNOSIS — Z7982 Long term (current) use of aspirin: Secondary | ICD-10-CM | POA: Diagnosis not present

## 2020-12-13 DIAGNOSIS — F0390 Unspecified dementia without behavioral disturbance: Secondary | ICD-10-CM | POA: Insufficient documentation

## 2020-12-13 DIAGNOSIS — S0101XA Laceration without foreign body of scalp, initial encounter: Secondary | ICD-10-CM | POA: Diagnosis not present

## 2020-12-13 DIAGNOSIS — F17210 Nicotine dependence, cigarettes, uncomplicated: Secondary | ICD-10-CM | POA: Diagnosis not present

## 2020-12-13 DIAGNOSIS — S0000XA Unspecified superficial injury of scalp, initial encounter: Secondary | ICD-10-CM | POA: Diagnosis present

## 2020-12-13 DIAGNOSIS — Z79899 Other long term (current) drug therapy: Secondary | ICD-10-CM | POA: Insufficient documentation

## 2020-12-13 DIAGNOSIS — W19XXXA Unspecified fall, initial encounter: Secondary | ICD-10-CM | POA: Insufficient documentation

## 2020-12-13 DIAGNOSIS — Z23 Encounter for immunization: Secondary | ICD-10-CM | POA: Insufficient documentation

## 2020-12-13 DIAGNOSIS — I119 Hypertensive heart disease without heart failure: Secondary | ICD-10-CM | POA: Insufficient documentation

## 2020-12-13 LAB — CBC WITH DIFFERENTIAL/PLATELET
Abs Immature Granulocytes: 0.03 10*3/uL (ref 0.00–0.07)
Basophils Absolute: 0 10*3/uL (ref 0.0–0.1)
Basophils Relative: 0 %
Eosinophils Absolute: 0.1 10*3/uL (ref 0.0–0.5)
Eosinophils Relative: 2 %
HCT: 43.4 % (ref 36.0–46.0)
Hemoglobin: 13.2 g/dL (ref 12.0–15.0)
Immature Granulocytes: 0 %
Lymphocytes Relative: 15 %
Lymphs Abs: 1.1 10*3/uL (ref 0.7–4.0)
MCH: 30.9 pg (ref 26.0–34.0)
MCHC: 30.4 g/dL (ref 30.0–36.0)
MCV: 101.6 fL — ABNORMAL HIGH (ref 80.0–100.0)
Monocytes Absolute: 0.4 10*3/uL (ref 0.1–1.0)
Monocytes Relative: 5 %
Neutro Abs: 5.6 10*3/uL (ref 1.7–7.7)
Neutrophils Relative %: 78 %
Platelets: 239 10*3/uL (ref 150–400)
RBC: 4.27 MIL/uL (ref 3.87–5.11)
RDW: 12.5 % (ref 11.5–15.5)
WBC: 7.3 10*3/uL (ref 4.0–10.5)
nRBC: 0 % (ref 0.0–0.2)

## 2020-12-13 LAB — URINALYSIS, ROUTINE W REFLEX MICROSCOPIC
Bilirubin Urine: NEGATIVE
Glucose, UA: NEGATIVE mg/dL
Hgb urine dipstick: NEGATIVE
Ketones, ur: NEGATIVE mg/dL
Leukocytes,Ua: NEGATIVE
Nitrite: NEGATIVE
Protein, ur: NEGATIVE mg/dL
Specific Gravity, Urine: 1.018 (ref 1.005–1.030)
pH: 7 (ref 5.0–8.0)

## 2020-12-13 LAB — BASIC METABOLIC PANEL
Anion gap: 10 (ref 5–15)
BUN: 16 mg/dL (ref 8–23)
CO2: 30 mmol/L (ref 22–32)
Calcium: 9.3 mg/dL (ref 8.9–10.3)
Chloride: 100 mmol/L (ref 98–111)
Creatinine, Ser: 0.71 mg/dL (ref 0.44–1.00)
GFR, Estimated: >60 mL/min (ref >60)
Glucose, Bld: 89 mg/dL (ref 70–99)
Potassium: 3.8 mmol/L (ref 3.5–5.1)
Sodium: 140 mmol/L (ref 135–145)

## 2020-12-13 LAB — PROTIME-INR
INR: 1 (ref 0.8–1.2)
Prothrombin Time: 12.5 seconds (ref 11.4–15.2)

## 2020-12-13 LAB — RESP PANEL BY RT-PCR (FLU A&B, COVID) ARPGX2
Influenza A by PCR: NEGATIVE
Influenza B by PCR: NEGATIVE
SARS Coronavirus 2 by RT PCR: NEGATIVE

## 2020-12-13 LAB — HEPATIC FUNCTION PANEL
ALT: 22 U/L (ref 0–44)
AST: 29 U/L (ref 15–41)
Albumin: 3.4 g/dL — ABNORMAL LOW (ref 3.5–5.0)
Alkaline Phosphatase: 65 U/L (ref 38–126)
Bilirubin, Direct: <0.1 mg/dL (ref 0.0–0.2)
Total Bilirubin: 0.7 mg/dL (ref 0.3–1.2)
Total Protein: 6.3 g/dL — ABNORMAL LOW (ref 6.5–8.1)

## 2020-12-13 LAB — LACTIC ACID, PLASMA: Lactic Acid, Venous: 1.3 mmol/L (ref 0.5–1.9)

## 2020-12-13 LAB — CK: Total CK: 73 U/L (ref 38–234)

## 2020-12-13 MED ORDER — TETANUS-DIPHTH-ACELL PERTUSSIS 5-2.5-18.5 LF-MCG/0.5 IM SUSY
0.5000 mL | PREFILLED_SYRINGE | Freq: Once | INTRAMUSCULAR | Status: AC
  Administered 2020-12-13: 15:00:00 0.5 mL via INTRAMUSCULAR
  Filled 2020-12-13: qty 0.5

## 2020-12-13 NOTE — ED Triage Notes (Addendum)
Pt to ED via EMS from [place]. C/o unwitnessed fall. Head lac, bleeding controlled. Pt not on blood thinners.   No medications given by EMS. Last VS: 146/80, hr 50, cbg 124, 99% 5L, Pt home 02 user.. Medical history COPD, Dementia. Unclear by EMS what pt baseline orientation.

## 2020-12-13 NOTE — Discharge Instructions (Addendum)
Your workup today was overall reassuring. Other than the laceration sustained during your fall, there were no significant injuries from the fall.   Keep area clean by washing with soap and water daily. Do not submerge in water or scrub stitches Apply a bandage at least once daily, change more often if it is dirty Watch for signs of infection (redness, drainage, worsening pain) Take Tylenol or Ibuprofen for pain as needed Have staples removed in 7-10 days

## 2020-12-13 NOTE — ED Provider Notes (Signed)
[facility] EMERGENCY DEPARTMENT Provider Note   CSN: [id number] Arrival date & time: 12/13/20  1220     History No chief complaint on file.   [name] [name] is a 75 y.o. female.  HPI Level 5 caveat secondary to dementia 75 year old female with a history of a AAA, CAD, COPD, dementia, CVA, hypertension, seizures, TIA who presents from [facility] with an unwitnessed fall.  I attempted to contact [facility] however was not able to be connected to someone who knew the patient's care.  Unclear if the patient's current mental status at baseline.  Responds to questions "I don't know"  is alert, responsive pain.  Talking nonsensically.  Patient is normally on 5 L nasal cannula, no increased O2 requirements at this time. Not on anti-coagulation   I did speak to the patient's daughter who states that the patient understands more than she can speak.  Confirms that when the patient is frustrated she will respond with "no".  States that the patient can sometimes be stubborn especially if she is not had a cigarette as she continues to smoke.  She states that she has severe dementia and deficits from her stroke which was back in the 1980s.    Past Medical History:  Diagnosis Date  . AAA (abdominal aortic aneurysm) [facility])    Stent repair February 2007  . Anemia   . CAD (coronary artery disease) 2002   Felt to have out-of-hospital inferior MI s/p stents to prox & mid RCA 07/2001 (had brief V-fib in cath lab)  . Carotid artery disease ([place])    Doppler November, 2011, 2-13% R. ICA, 08-65% LICA, atypical flow in left vertebral artery,  . Complication of anesthesia   . COPD (chronic obstructive pulmonary disease) (HCC)    Severe, O2 dependent 24 hours per day (chronic respiratory failure)  . CVA (cerebral vascular accident) ([place]) 1980's   Left middle cerebral artery aneurysm, repaired in 1982 (per chart had hx of SAH), history CVA with residual expressive aphasia  . Dementia    . Diverticulosis    With hx of abscess, perforation, colectomy, colostomy, colostomy repair  . Dyslipidemia   . Ecchymosis    Right hand  . Hypertension   . Peripheral vascular disease ([place])    "poor circulation "  . Seizure disorder [facility])    Result of cerebral aneurysm  . Seizures ([place])   . Shortness of breath   . TIA (transient ischemic attack)   . Tobacco abuse   . Tobacco abuse     Patient Active Problem List   Diagnosis Date Noted  . Expressive aphasia 05/16/2012  . Acute delirium 05/16/2012  . Patella fracture, right, closed, initial encounter 05/14/2012  . Hypoxemia 05/14/2012  . Wheezes 05/14/2012  . Dementia ([place]) 04/26/2012  . CVA (cerebral vascular accident) ([place])   . COPD (chronic obstructive pulmonary disease) ([place])   . AAA (abdominal aortic aneurysm) ([place])   . CAD (coronary artery disease)   . Carotid artery disease ([place])   . Advance directive discussed with patient 03/11/2011  . SEIZURE DISORDER, COMPLEX PARTIAL 09/01/2008  . HYPERCHOLESTEROLEMIA 02/27/2007  . HYPERTRIGLYCERIDEMIA 02/27/2007  . Tobacco use disorder 02/27/2007    Past Surgical History:  Procedure Laterality Date  . ABDOMINAL AORTIC ANEURYSM REPAIR  2007   Stent grafted.  Marland Kitchen BRAIN SURGERY     Brain aneurysm, has plates and clips  . COLON SURGERY    . colostomy     Now reversed.   Marland Kitchen  COLOSTOMY CLOSURE  ?    2007  . ORIF PATELLA  05/15/2012   Procedure: OPEN REDUCTION INTERNAL (ORIF) FIXATION PATELLA;  Surgeon: Rozanna Box, MD;  Location: [facility];  Service: Orthopedics;  Laterality: Right;  ORIF Patella  . PATELLA FRACTURE SURGERY  05/15/2012  . RCA stent  2002   Following NSTEMI     OB History   No obstetric history on file.     Family History  Problem Relation Age of Onset  . Heart disease Other   . Kidney failure Other     Social History   Tobacco Use  . Smoking status: Current Every Day Smoker    Packs/day: 0.25    Years: 55.00    Pack years: 13.75    Types:  Cigarettes  . Smokeless tobacco: Never Used  Substance Use Topics  . Alcohol use: No  . Drug use: No    Home Medications Prior to Admission medications   Medication Sig Start Date End Date Taking? Authorizing Provider  acetaminophen (TYLENOL) 500 MG tablet Take 500 mg by mouth every 4 (four) hours as needed for mild pain, fever or headache.   Yes [provider]  aluminum-magnesium hydroxide-simethicone (MAALOX) 010-272-53 MG/5ML SUSP Take 30 mLs by mouth as needed (heartburn/indigestion).   Yes [provider]  aspirin 81 MG EC tablet Take 81 mg by mouth daily.   Yes [provider]  guaiFENesin (ROBITUSSIN) 100 MG/5ML liquid Take 100 mg by mouth every 6 (six) hours as needed for cough.   Yes [provider]  levETIRAcetam (KEPPRA) 500 MG tablet Take 500 mg by mouth 2 (two) times daily. 8am, 5pm   Yes [provider]  lisinopril (ZESTRIL) 5 MG tablet Take 5 mg by mouth daily.   Yes [provider]  loperamide (IMODIUM) 2 MG capsule Take 2 mg by mouth as needed for diarrhea or loose stools.   Yes [provider]  magnesium hydroxide (MILK OF MAGNESIA) 400 MG/5ML suspension Take 30 mLs by mouth at bedtime as needed for mild constipation.   Yes [provider]  metoprolol tartrate (LOPRESSOR) 25 MG tablet Take 25 mg by mouth 2 (two) times daily.   Yes [provider]  Multiple Vitamins-Minerals (MULTIVITAMINS THER. W/MINERALS) TABS tablet Take 1 tablet by mouth daily.   Yes [provider]  neomycin-bacitracin-polymyxin (NEOSPORIN) [phone] ointment Apply 1 application topically as needed (skin tears).   Yes [provider]  OLANZapine (ZYPREXA) 2.5 MG tablet Take 2.5 mg by mouth at bedtime.   Yes [provider]  OXYGEN Inhale 2 L into the lungs as needed (shortness of breath).   Yes [provider]  polyethylene glycol (MIRALAX / GLYCOLAX) 17 g packet Take 17 g by mouth daily.    Yes [provider]  PRESCRIPTION MEDICATION Take 118 mLs by mouth in the morning, at noon, and at bedtime. Mighty shakes   Yes [provider]  senna (SENOKOT) 8.6 MG TABS tablet Take 8.6 mg by mouth daily.   Yes [provider]  traMADol (ULTRAM) 50 MG tablet Take 50 mg by mouth in the morning, at noon, and at bedtime.   Yes [provider]    Allergies    Penicillins  Review of Systems   Review of Systems  Unable to perform ROS: Dementia    Physical Exam Updated Vital Signs BP (!) 141/101   Pulse (!) 58   Temp (!) 96.8 F (36 C)   Resp (!)  26   Ht 5' (1.524 m)   Wt 62.5 kg   SpO2 91%   BMI 26.91 kg/m   Physical Exam Vitals and nursing note reviewed.  Constitutional:      General: She is not in acute distress.    Appearance: She is well-developed and well-nourished.     Comments: Cachectic appearing  HENT:     Head: Normocephalic and atraumatic.      Comments: 2 cmx 53mm laceration to the right occipital area of the scalp.  No other noticeable step-offs or crepitus.  No battle sign.  No raccoon eyes.  No hemotympanum.  No hyphema Eyes:     Conjunctiva/sclera: Conjunctivae normal.  Cardiovascular:     Rate and Rhythm: Normal rate and regular rhythm.     Pulses: Normal pulses.     Heart sounds: Normal heart sounds. No murmur heard.   Pulmonary:     Effort: Pulmonary effort is normal. No respiratory distress.     Breath sounds: Normal breath sounds.  Abdominal:     Palpations: Abdomen is soft.     Tenderness: There is no abdominal tenderness.  Musculoskeletal:        General: No tenderness, deformity or edema. Normal range of motion.     Cervical back: Normal range of motion and neck supple.     Comments: Moving all 4 extremities spontaneously, though she is occasionally grabbing at her right shoulder.  No pelvic tenderness.  Skin:    General: Skin is warm and dry.     Capillary Refill: Capillary refill takes less than 2  seconds.     Findings: Erythema and lesion present. No rash.  Neurological:     General: No focal deficit present.     Mental Status: She is alert and oriented to person, place, and time.     Comments: Neurologic exam difficult to assess secondary to dementia.  Alert, not answering any questions, not following two-step commands moving all 4 extremities without difficulty and spontaneously  Psychiatric:        Mood and Affect: Mood and affect and mood normal.        Behavior: Behavior normal.     ED Results / Procedures / Treatments   Labs (all labs ordered are listed, but only abnormal results are displayed) Labs Reviewed  CBC WITH DIFFERENTIAL/PLATELET - Abnormal; Notable for the following components:      Result Value   MCV 101.6 (*)    All other components within normal limits  URINALYSIS, ROUTINE W REFLEX MICROSCOPIC - Abnormal; Notable for the following components:   APPearance HAZY (*)    All other components within normal limits  HEPATIC FUNCTION PANEL - Abnormal; Notable for the following components:   Total Protein 6.3 (*)    Albumin 3.4 (*)    All other components within normal limits  RESP PANEL BY RT-PCR (FLU A&B, COVID) ARPGX2  URINE CULTURE  BASIC METABOLIC PANEL  PROTIME-INR  CK  LACTIC ACID, PLASMA    EKG EKG Interpretation  Date/Time:  Tuesday December 13 2020 12:22:56 EST Ventricular Rate:  50 PR Interval:    QRS Duration: 74 QT Interval:  467 QTC Calculation: 426 R Axis:   80 Text Interpretation: Sinus rhythm Nonspecific T abnormalities, lateral leads Confirmed by Elnora Morrison [phone]) on 12/13/2020 12:25:04 PM   Radiology DG Pelvis 1-2 Views  Result Date: 12/13/2020 CLINICAL DATA:  Fall. EXAM: PELVIS - 1-2 VIEW COMPARISON:  No prior. FINDINGS: Diffuse osteopenia. Degenerative changes lumbar spine  and both hips. No acute bony abnormality. Stool noted throughout the colon. Aortoiliac stents. Aortoiliac and peripheral vascular calcification.  IMPRESSION: 1. Diffuse osteopenia. Degenerative changes lumbar spine and both hips. No acute bony abnormality. 2. Aortoiliac stents noted.  Peripheral vascular disease. Electronically Signed   By: Marcello Moores  Register   On: 12/13/2020 13:20   DG Shoulder Right  Result Date: 12/13/2020 CLINICAL DATA:  Fall. EXAM: RIGHT SHOULDER - 2+ VIEW COMPARISON:  02/28/2014 FINDINGS: No acute fractures identified. At the level of prior humeral neck fracture, healed deformity is present. No evidence of dislocation. Bones are osteopenic. Soft tissues are unremarkable. IMPRESSION: No acute findings. Old healed humeral neck fracture. Electronically Signed   By: Aletta Edouard M.D.   On: 12/13/2020 13:24   CT Head Wo Contrast  Result Date: 12/13/2020 CLINICAL DATA:  Unwitnessed fall. EXAM: CT HEAD WITHOUT CONTRAST CT CERVICAL SPINE WITHOUT CONTRAST TECHNIQUE: Multidetector CT imaging of the head and cervical spine was performed following the standard protocol without intravenous contrast. Multiplanar CT image reconstructions of the cervical spine were also generated. COMPARISON:  Head CT from 2013. FINDINGS: CT HEAD FINDINGS Brain: Extensive area encephalomalacia involving the left MCA territory most consistent with a large chronic infarct. There are also postoperative changes with a large craniotomy defect in the left frontal parietal area. Left MCA aneurysm clip noted with associated artifact. Marked ex vacuo dilatation of the left lateral ventricle. Chronic periventricular white matter disease noted on the right side. No findings for acute right-sided infarct and no evidence of intracranial hemorrhage. Brainstem and cerebellum are grossly normal in stable. Vascular: Stable vascular calcifications.  No hyperdense vessels. Skull: Large calvarial defect in the left frontoparietal region. No acute skull fracture or bone lesion. Sinuses/Orbits: The paranasal sinuses and mastoid air cells are grossly clear. There is mild  mucoperiosteal thickening involving the left maxillary sinus along with a small amount of fluid. Other: No scalp lesions or scalp hematoma. CT CERVICAL SPINE FINDINGS Alignment: Normal Skull base and vertebrae: No acute fracture. No primary bone lesion or focal pathologic process. Soft tissues and spinal canal: No prevertebral fluid or swelling. No visible canal hematoma. Disc levels: The spinal canal is fairly generous. No large disc protrusions, significant spinal or foraminal stenosis. Upper chest: Advanced emphysematous changes but no apical lung lesions. Other: Advanced bilateral carotid artery calcifications. IMPRESSION: 1. Extensive area of encephalomalacia involving the left MCA territory most consistent with a large chronic infarct. 2. Left MCA aneurysm clip in place. 3. Chronic periventricular white matter disease. 4. No acute intracranial findings or skull fracture. 5. Normal alignment of the cervical vertebral bodies and no acute cervical spine fracture. Electronically Signed   By: Marijo Sanes M.D.   On: 12/13/2020 13:09   CT Cervical Spine Wo Contrast  Result Date: 12/13/2020 CLINICAL DATA:  Unwitnessed fall. EXAM: CT HEAD WITHOUT CONTRAST CT CERVICAL SPINE WITHOUT CONTRAST TECHNIQUE: Multidetector CT imaging of the head and cervical spine was performed following the standard protocol without intravenous contrast. Multiplanar CT image reconstructions of the cervical spine were also generated. COMPARISON:  Head CT from 2013. FINDINGS: CT HEAD FINDINGS Brain: Extensive area encephalomalacia involving the left MCA territory most consistent with a large chronic infarct. There are also postoperative changes with a large craniotomy defect in the left frontal parietal area. Left MCA aneurysm clip noted with associated artifact. Marked ex vacuo dilatation of the left lateral ventricle. Chronic periventricular white matter disease noted on the right side. No findings for acute right-sided infarct and  no  evidence of intracranial hemorrhage. Brainstem and cerebellum are grossly normal in stable. Vascular: Stable vascular calcifications.  No hyperdense vessels. Skull: Large calvarial defect in the left frontoparietal region. No acute skull fracture or bone lesion. Sinuses/Orbits: The paranasal sinuses and mastoid air cells are grossly clear. There is mild mucoperiosteal thickening involving the left maxillary sinus along with a small amount of fluid. Other: No scalp lesions or scalp hematoma. CT CERVICAL SPINE FINDINGS Alignment: Normal Skull base and vertebrae: No acute fracture. No primary bone lesion or focal pathologic process. Soft tissues and spinal canal: No prevertebral fluid or swelling. No visible canal hematoma. Disc levels: The spinal canal is fairly generous. No large disc protrusions, significant spinal or foraminal stenosis. Upper chest: Advanced emphysematous changes but no apical lung lesions. Other: Advanced bilateral carotid artery calcifications. IMPRESSION: 1. Extensive area of encephalomalacia involving the left MCA territory most consistent with a large chronic infarct. 2. Left MCA aneurysm clip in place. 3. Chronic periventricular white matter disease. 4. No acute intracranial findings or skull fracture. 5. Normal alignment of the cervical vertebral bodies and no acute cervical spine fracture. Electronically Signed   By: Marijo Sanes M.D.   On: 12/13/2020 13:09   DG Chest Portable 1 View  Result Date: 12/13/2020 CLINICAL DATA:  [place].  Hit head.  Hypoxia. EXAM: PORTABLE CHEST 1 VIEW COMPARISON:  Chest x-ray 02/28/2014 and CT scan 10/13/2019 FINDINGS: The cardiac silhouette, mediastinal and hilar contours are within normal limits. Stable tortuosity and calcification of the thoracic aorta. Stable emphysematous changes and fairly marked hyperinflation. No infiltrates, edema or effusions. No worrisome pulmonary lesions. No pneumothorax. The bony thorax is intact. Remote healed right rib  fractures are noted. IMPRESSION: No acute cardiopulmonary findings. Chronic emphysematous changes. Intact bony thorax.  Remote healed right rib fractures are noted. Electronically Signed   By: Marijo Sanes M.D.   On: 12/13/2020 12:53    Procedures .Marland KitchenLaceration Repair  Date/Time: 12/13/2020 2:48 PM Performed by: Garald Balding, PA-C Authorized by: Garald Balding, PA-C   Consent:    Consent obtained:  Verbal   Consent given by:  Patient   Risks discussed:  Infection, need for additional repair, pain, poor cosmetic result and poor wound healing   Alternatives discussed:  No treatment and delayed treatment Universal protocol:    Procedure explained and questions answered to patient or proxy's satisfaction: yes     Relevant documents present and verified: yes     Test results available: yes     Imaging studies available: yes     Required blood products, implants, devices, and special equipment available: yes     Site/side marked: yes     Immediately prior to procedure, a time out was called: yes     Patient identity confirmed:  Verbally with patient Anesthesia:    Anesthesia method:  None Laceration details:    Length (cm):  2   Depth (mm):  1 Pre-procedure details:    Preparation:  Patient was prepped and draped in usual sterile fashion Exploration:    Wound extent: no areolar tissue violation noted, no fascia violation noted, no foreign bodies/material noted and no underlying fracture noted     Contaminated: no   Treatment:    Area cleansed with:  Povidone-iodine and saline   Amount of cleaning:  Extensive   Irrigation solution:  Sterile saline   Irrigation volume:  20   Irrigation method:  Syringe   Visualized foreign bodies/material removed: no  Debridement:  None Skin repair:    Repair method:  Staples   Number of staples:  2 Approximation:    Approximation:  Close Repair type:    Repair type:  Simple Post-procedure details:    Dressing:  Non-adherent  dressing   Procedure completion:  Tolerated well, no immediate complications   (including critical care time)  Medications Ordered in ED Medications  Tdap (BOOSTRIX) injection 0.5 mL (0.5 mLs Intramuscular Given 12/13/20 1457)    ED Course  I have reviewed the triage vital signs and the nursing notes.  Pertinent labs & imaging results that were available during my care of the patient were reviewed by me and considered in my medical decision making (see chart for details).    MDM Rules/Calculators/A&P                          CC: Unwitnessed fall, limited history 2/2 patient's dementia VS: Blood pressure slightly hypertensive, 166/79, temperature 96.8, pulse of 50, respirations 18 and O2 sats 100% on her normal 5 L of oxygen via nasal cannula, no increased O2 requirements  HX: Limited history, was acquired through chart review as I was not able to speak with anyone at [facility] and the patient has dementia  DDX:The patient's complaint of fall involves an extensive number of diagnostic and treatment options, and is a complaint that carries with it a high risk of complications, morbidity, and potential mortality. Given the large differential diagnosis, medical decision making is of high complexity. Includes mechanical fall, infection, stroke, dissection, ACS, PE, etc  Labs: I ordered reviewed and interpreted labs which include -CBC without leukocytosis, normal hemoglobin -UA without evidence of UTI -Lactic acid normal -BMP unremarkabl -Hepatic function panel without significant abnormalities  -Covid is negative -CK normal Imaging: I ordered and reviewed images which included chest x-ray, pelvic x-ray, right shoulder x-ray, CT of the head and C-spine. I independently visualized and interpreted all imaging. Significant findings include extensive area of encephalomalacia with the left MCA territory consistent with large chronic infarct.  She also has a left MCA aneurysm clip in  place.  However no acute intracranial abnormalities were found.  Normal C-spine CT without evidence of fracture.  Right shoulder with an old healed humeral neck fracture but no acute findings.  Pelvic x-ray without evidence of acute fractures, with diffuse osteopenia.  Chest x-ray with remote healed right rib fracture, no acute findings.  There are no acute, significant findings on today's images.  EKG: NS rhythm  MDM: No evidence of significant injury from the fall.  Patient's laceration was excessively cleaned, placed 2 staples, tetanus updated.  Patient tolerated procedure well.  Labs overall very reassuring.  No evidence of sepsis or infection.  Low suspicion for dissection, PE, ACS.  No new evidence of intracranial abnormality.  Patient appears to be at mentally at baseline as per discussion patient's daughter.  Patient was instructed to remove staples in 7 to 10 days, lac care information provided.   Patient disposition:The patient appears reasonably screened and/or stabilized for discharge and I doubt any other medical condition or other [facility] requiring further screening, evaluation, or treatment in the ED at this time prior to discharge. I have discussed lab and/or imaging findings with the patient's daughter and answered all questions/concerns to the best of my ability.I have discussed return precautions and OP follow up.    This was a shared visit with my supervising physician Dr. Reather Converse who independently saw  and evaluated the patient & provided guidance in evaluation/management/disposition ,in agreement with care   Final Clinical Impression(s) / ED Diagnoses Final diagnoses:  Fall, initial encounter    Rx / DC Orders ED Discharge Orders    None       Garald Balding, PA-C 12/13/20 1524    Elnora Morrison, MD 12/13/20 1526

## 2020-12-13 NOTE — ED Notes (Signed)
SPO2 dropped to 86% on RA, pt place on O2 [place] 2L up to 95%

## 2020-12-13 NOTE — ED Notes (Signed)
Patient transported to CT 

## 2020-12-14 LAB — URINE CULTURE: Culture: <10000 — AB

## 2022-02-28 DEATH — deceased
